# Patient Record
Sex: Female | Born: 1940 | Race: White | Hispanic: No | Marital: Single | Smoking: Never smoker
Health system: Southern US, Community
[De-identification: ages and names within clinical notes are randomized; demographics above are authoritative.]

## PROBLEM LIST (undated history)

## (undated) ENCOUNTER — Emergency Department (HOSPITAL_COMMUNITY): Payer: Medicare PPO | Source: Home / Self Care

## (undated) DIAGNOSIS — M549 Dorsalgia, unspecified: Secondary | ICD-10-CM

## (undated) DIAGNOSIS — M316 Other giant cell arteritis: Secondary | ICD-10-CM

## (undated) DIAGNOSIS — J189 Pneumonia, unspecified organism: Secondary | ICD-10-CM

## (undated) DIAGNOSIS — G8929 Other chronic pain: Secondary | ICD-10-CM

## (undated) HISTORY — PX: TUBAL LIGATION: SHX77

---

## 2015-09-26 ENCOUNTER — Inpatient Hospital Stay
Admission: EM | Admit: 2015-09-26 | Discharge: 2015-09-28 | DRG: 683 | Payer: Medicare PPO | Attending: Internal Medicine | Admitting: Internal Medicine

## 2015-09-26 ENCOUNTER — Encounter: Payer: Self-pay | Admitting: Emergency Medicine

## 2015-09-26 DIAGNOSIS — N182 Chronic kidney disease, stage 2 (mild): Secondary | ICD-10-CM | POA: Diagnosis present

## 2015-09-26 DIAGNOSIS — N179 Acute kidney failure, unspecified: Secondary | ICD-10-CM | POA: Diagnosis not present

## 2015-09-26 DIAGNOSIS — I129 Hypertensive chronic kidney disease with stage 1 through stage 4 chronic kidney disease, or unspecified chronic kidney disease: Secondary | ICD-10-CM | POA: Diagnosis present

## 2015-09-26 DIAGNOSIS — Z79899 Other long term (current) drug therapy: Secondary | ICD-10-CM | POA: Diagnosis not present

## 2015-09-26 DIAGNOSIS — G8929 Other chronic pain: Secondary | ICD-10-CM | POA: Diagnosis present

## 2015-09-26 DIAGNOSIS — Z9851 Tubal ligation status: Secondary | ICD-10-CM

## 2015-09-26 DIAGNOSIS — Z7952 Long term (current) use of systemic steroids: Secondary | ICD-10-CM | POA: Diagnosis not present

## 2015-09-26 DIAGNOSIS — N281 Cyst of kidney, acquired: Secondary | ICD-10-CM | POA: Diagnosis present

## 2015-09-26 DIAGNOSIS — N390 Urinary tract infection, site not specified: Secondary | ICD-10-CM | POA: Diagnosis present

## 2015-09-26 DIAGNOSIS — E860 Dehydration: Secondary | ICD-10-CM | POA: Diagnosis present

## 2015-09-26 DIAGNOSIS — E871 Hypo-osmolality and hyponatremia: Secondary | ICD-10-CM

## 2015-09-26 DIAGNOSIS — M1990 Unspecified osteoarthritis, unspecified site: Secondary | ICD-10-CM | POA: Diagnosis present

## 2015-09-26 DIAGNOSIS — I959 Hypotension, unspecified: Secondary | ICD-10-CM

## 2015-09-26 DIAGNOSIS — M545 Low back pain: Secondary | ICD-10-CM | POA: Diagnosis present

## 2015-09-26 HISTORY — DX: Other giant cell arteritis: M31.6

## 2015-09-26 HISTORY — DX: Other chronic pain: G89.29

## 2015-09-26 HISTORY — DX: Pneumonia, unspecified organism: J18.9

## 2015-09-26 HISTORY — DX: Dorsalgia, unspecified: M54.9

## 2015-09-26 LAB — BASIC METABOLIC PANEL
Anion gap: 22 — ABNORMAL HIGH (ref 5–15)
BUN: 109 mg/dL — AB (ref 6–20)
CO2: 13 mmol/L — ABNORMAL LOW (ref 22–32)
CREATININE: 4.89 mg/dL — AB (ref 0.44–1.00)
Calcium: 9 mg/dL (ref 8.9–10.3)
Chloride: 96 mmol/L — ABNORMAL LOW (ref 101–111)
GFR, EST AFRICAN AMERICAN: 9 mL/min — AB (ref >60)
GFR, EST NON AFRICAN AMERICAN: 8 mL/min — AB (ref >60)
Glucose, Bld: 113 mg/dL — ABNORMAL HIGH (ref 65–99)
POTASSIUM: 4.8 mmol/L (ref 3.5–5.1)
SODIUM: 131 mmol/L — AB (ref 135–145)

## 2015-09-26 LAB — CBC
HCT: 46.8 % (ref 35.0–47.0)
Hemoglobin: 15 g/dL (ref 12.0–16.0)
MCH: 30 pg (ref 26.0–34.0)
MCHC: 32 g/dL (ref 32.0–36.0)
MCV: 93.8 fL (ref 80.0–100.0)
PLATELETS: 305 10*3/uL (ref 150–440)
RBC: 4.99 MIL/uL (ref 3.80–5.20)
RDW: 13.6 % (ref 11.5–14.5)
WBC: 15.2 10*3/uL — AB (ref 3.6–11.0)

## 2015-09-26 LAB — URINALYSIS COMPLETE WITH MICROSCOPIC (ARMC ONLY)
Bilirubin Urine: NEGATIVE
GLUCOSE, UA: NEGATIVE mg/dL
NITRITE: NEGATIVE
Protein, ur: NEGATIVE mg/dL
Specific Gravity, Urine: 1.009 (ref 1.005–1.030)
pH: 5 (ref 5.0–8.0)

## 2015-09-26 LAB — CK: CK TOTAL: 116 U/L (ref 38–234)

## 2015-09-26 MED ORDER — CLONAZEPAM 0.25 MG PO TBDP: 0.2500 mg | ORAL_TABLET | Freq: Every day | ORAL | Status: DC

## 2015-09-26 MED ORDER — ACETAMINOPHEN 325 MG PO TABS: 650.0000 mg | ORAL_TABLET | Freq: Four times a day (QID) | ORAL | Status: DC | PRN

## 2015-09-26 MED ORDER — DOCUSATE SODIUM 100 MG PO CAPS
100.0000 mg | ORAL_CAPSULE | Freq: Two times a day (BID) | ORAL | Status: DC
  Administered 2015-09-27 – 2015-09-28 (×3): 100 mg via ORAL
  Filled 2015-09-26 (×3): qty 1

## 2015-09-26 MED ORDER — SODIUM CHLORIDE 0.9 % IV SOLN
INTRAVENOUS | Status: DC
  Administered 2015-09-27: 01:00:00 via INTRAVENOUS

## 2015-09-26 MED ORDER — CYCLOBENZAPRINE HCL 10 MG PO TABS
10.0000 mg | ORAL_TABLET | Freq: Two times a day (BID) | ORAL | Status: DC
Start: 2015-09-26 — End: 2015-09-28
  Administered 2015-09-26 – 2015-09-28 (×4): 10 mg via ORAL
  Filled 2015-09-26 (×4): qty 1

## 2015-09-26 MED ORDER — CIPROFLOXACIN IN D5W 200 MG/100ML IV SOLN
200.0000 mg | INTRAVENOUS | Status: DC
  Administered 2015-09-26 – 2015-09-27 (×2): 200 mg via INTRAVENOUS
  Filled 2015-09-26 (×3): qty 100

## 2015-09-26 MED ORDER — BISACODYL 10 MG RE SUPP
10.0000 mg | Freq: Every day | RECTAL | Status: DC | PRN
  Filled 2015-09-26: qty 1

## 2015-09-26 MED ORDER — PANTOPRAZOLE SODIUM 40 MG PO TBEC
40.0000 mg | DELAYED_RELEASE_TABLET | Freq: Every day | ORAL | Status: DC
  Administered 2015-09-27 – 2015-09-28 (×2): 40 mg via ORAL
  Filled 2015-09-26 (×2): qty 1

## 2015-09-26 MED ORDER — PREDNISONE 5 MG PO TABS
7.5000 mg | ORAL_TABLET | Freq: Every day | ORAL | Status: DC
  Administered 2015-09-27 – 2015-09-28 (×2): 7.5 mg via ORAL
  Filled 2015-09-26 (×2): qty 2

## 2015-09-26 MED ORDER — ACETAMINOPHEN 650 MG RE SUPP: 650.0000 mg | Freq: Four times a day (QID) | RECTAL | Status: DC | PRN

## 2015-09-26 MED ORDER — HEPARIN SODIUM (PORCINE) 5000 UNIT/ML IJ SOLN
5000.0000 [IU] | Freq: Three times a day (TID) | INTRAMUSCULAR | Status: DC
  Administered 2015-09-26 – 2015-09-27 (×4): 5000 [IU] via SUBCUTANEOUS
  Filled 2015-09-26 (×4): qty 1

## 2015-09-26 MED ORDER — ALBUTEROL SULFATE (2.5 MG/3ML) 0.083% IN NEBU: 2.5000 mg | INHALATION_SOLUTION | RESPIRATORY_TRACT | Status: DC | PRN

## 2015-09-26 MED ORDER — SODIUM CHLORIDE 0.9 % IV BOLUS (SEPSIS)
1000.0000 mL | Freq: Once | INTRAVENOUS | Status: AC
  Administered 2015-09-26: 1000 mL via INTRAVENOUS

## 2015-09-26 MED ORDER — LORATADINE 10 MG PO TABS: 10.0000 mg | ORAL_TABLET | Freq: Every day | ORAL | Status: DC | PRN

## 2015-09-26 MED ORDER — ONDANSETRON HCL 4 MG/2ML IJ SOLN: 4.0000 mg | Freq: Four times a day (QID) | INTRAMUSCULAR | Status: DC | PRN

## 2015-09-26 MED ORDER — HYDROCODONE-ACETAMINOPHEN 5-325 MG PO TABS
1.0000 | ORAL_TABLET | ORAL | Status: DC | PRN
  Administered 2015-09-27: 1 via ORAL
  Filled 2015-09-26: qty 1

## 2015-09-26 MED ORDER — CLONAZEPAM 0.5 MG PO TABS
ORAL_TABLET | ORAL | Status: AC
  Administered 2015-09-26: 0.25 mg
  Filled 2015-09-26: qty 1

## 2015-09-26 MED ORDER — ONDANSETRON HCL 4 MG PO TABS
4.0000 mg | ORAL_TABLET | Freq: Four times a day (QID) | ORAL | Status: DC | PRN
Start: 2015-09-26 — End: 2015-09-28

## 2015-09-26 MED ORDER — POLYETHYLENE GLYCOL 3350 17 G PO PACK: 17.0000 g | PACK | Freq: Every day | ORAL | Status: DC | PRN

## 2015-09-26 NOTE — H&P (Signed)
[facility] - [place] at [facility]   PATIENT NAME: Kerri Carroll    MR#:  [id number]  DATE OF BIRTH:  August 07, 1941  DATE OF ADMISSION:  09/26/2015  PRIMARY CARE PHYSICIAN: Lorenda Ishihara   REQUESTING/REFERRING PHYSICIAN: Dr. Sharma Covert  CHIEF COMPLAINT:   Chief Complaint  Patient presents with  . Hypotension    HISTORY OF PRESENT ILLNESS:  Kerri Carroll  is a 74 y.o. female with a known history of CKD, hypertension, arthritis who recently had acutely worsening low back pain was found to be hypotensive at physical therapy. Was sent to the emergency room her blood pressures have been fluctuating between systolic of 60-90. Has received 2 doses of IV normal saline bolus. Creatinine elevated 4.69 with BUN of 109. Patient continues to have low normal blood pressure along with urinary tract infection and is being admitted to the hospitalist service for acute renal failure. She tells me that she does have some renal insufficiency but is not sure what her GFR or creatinine are. All her health care has been at [facility]. She also has doctors making house call as her PCP. She has had some urinary urgency and burning. No blood in urine. She has been mostly bedbound since her worsening back pain.  PAST MEDICAL HISTORY:   Past Medical History  Diagnosis Date  . Chronic back pain   . Pneumonia   . Arteritis (HCC)     PAST SURGICAL HISTORY:   Past Surgical History  Procedure Laterality Date  . Tubal ligation      SOCIAL HISTORY:   Social History  Substance Use Topics  . Smoking status: Never Smoker   . Smokeless tobacco: Never Used  . Alcohol Use: 0.6 oz/week    1 Glasses of wine per week     Comment: Everyday    FAMILY HISTORY:  History reviewed. No pertinent family history.  DRUG ALLERGIES:   Allergies  Allergen Reactions  . Shellfish Allergy Other (See Comments)    Reaction:  Makes pt sweaty     REVIEW OF SYSTEMS:   Review of  Systems  Constitutional: Positive for malaise/fatigue. Negative for fever, chills and weight loss.  HENT: Negative for hearing loss and nosebleeds.   Eyes: Negative for blurred vision, double vision and pain.  Respiratory: Negative for cough, hemoptysis, sputum production, shortness of breath and wheezing.   Cardiovascular: Negative for chest pain, palpitations, orthopnea and leg swelling.  Gastrointestinal: Negative for nausea, vomiting, abdominal pain, diarrhea and constipation.  Genitourinary: Positive for dysuria and urgency. Negative for hematuria.  Musculoskeletal: Positive for back pain and joint pain. Negative for myalgias and falls.  Skin: Negative for rash.  Neurological: Positive for dizziness and weakness. Negative for tremors, sensory change, speech change, focal weakness, seizures and headaches.  Endo/Heme/Allergies: Does not bruise/bleed easily.  Psychiatric/Behavioral: Negative for depression and memory loss. The patient is not nervous/anxious.     MEDICATIONS AT HOME:   Prior to Admission medications   Medication Sig Start Date End Date Taking? Authorizing Provider  Biotin 1000 MCG tablet Take 1,000 mcg by mouth daily.   Yes Historical Provider, MD  cholecalciferol (VITAMIN D) 1000 UNITS tablet Take 1,000 Units by mouth daily.   Yes Historical Provider, MD  clonazePAM (KLONOPIN) 0.25 MG disintegrating tablet Take 0.25 mg by mouth at bedtime.   Yes Historical Provider, MD  cyclobenzaprine (FLEXERIL) 5 MG tablet Take 10 mg by mouth 2 (two) times daily.   Yes Historical Provider, MD  furosemide (LASIX)  20 MG tablet Take 20 mg by mouth daily.   Yes Historical Provider, MD  lisinopril (PRINIVIL,ZESTRIL) 10 MG tablet Take 10 mg by mouth daily.   Yes Historical Provider, MD  loratadine (CLARITIN) 10 MG tablet Take 10 mg by mouth daily as needed for allergies.   Yes Historical Provider, MD  metoprolol tartrate (LOPRESSOR) 25 MG tablet Take 25 mg by mouth 2 (two) times daily.   Yes  Historical Provider, MD  omeprazole (PRILOSEC) 20 MG capsule Take 20 mg by mouth daily.   Yes Historical Provider, MD  potassium chloride (K-DUR) 10 MEQ tablet Take 10 mEq by mouth daily.   Yes Historical Provider, MD  predniSONE (DELTASONE) 5 MG tablet Take 7.5 mg by mouth daily.   Yes Historical Provider, MD  vitamin B-12 (CYANOCOBALAMIN) 1000 MCG tablet Take 1,000 mcg by mouth daily.   Yes Historical Provider, MD      VITAL SIGNS:  Blood pressure 91/74, pulse 95, temperature 97.6 F (36.4 C), temperature source Oral, resp. rate 20, height 5' 2.5" (1.588 m), weight 65.772 kg (145 lb), SpO2 100 %.  PHYSICAL EXAMINATION:  Physical Exam  GENERAL:  74 y.o.-year-old patient lying in the bed with no acute distress.  EYES: Pupils equal, round, reactive to light and accommodation. No scleral icterus. Extraocular muscles intact.  HEENT: Head atraumatic, normocephalic. Oropharynx and nasopharynx clear. No oropharyngeal erythema, moist oral mucosa  NECK:  Supple, no jugular venous distention. No thyroid enlargement, no tenderness.  LUNGS: Normal breath sounds bilaterally, no wheezing, rales, rhonchi. No use of accessory muscles of respiration.  CARDIOVASCULAR: S1, S2 normal. No murmurs, rubs, or gallops.  ABDOMEN: Soft, nontender, nondistended. Bowel sounds present. No organomegaly or mass.  EXTREMITIES: No pedal edema, cyanosis, or clubbing. + 2 pedal & radial pulses b/l.   NEUROLOGIC: Cranial nerves II through XII are intact. No focal Motor or sensory deficits appreciated b/l PSYCHIATRIC: The patient is alert and oriented x 3. Good affect.  SKIN: No obvious rash, lesion, or ulcer.   LABORATORY PANEL:   CBC  Recent Labs Lab 09/26/15 1651  WBC 15.2*  HGB 15.0  HCT 46.8  PLT 305   ------------------------------------------------------------------------------------------------------------------  Chemistries   Recent Labs Lab 09/26/15 1651  NA 131*  K 4.8  CL 96*  CO2 13*   GLUCOSE 113*  BUN 109*  CREATININE 4.89*  CALCIUM 9.0   ------------------------------------------------------------------------------------------------------------------  Cardiac Enzymes No results for input(s): TROPONINI in the last 168 hours. ------------------------------------------------------------------------------------------------------------------  RADIOLOGY:  No results found.   IMPRESSION AND PLAN:   * ARF over CKD Likely from dehydration and continuing to take her Lasix and other antihypertensive medications. Medications held. Start IV fluids and monitor input and output along with daily weights. Check renal ultrasound. Consult nephrology if no improvement. Check CK as patient has been mostly bedbound  * UTI Start renally dosed ciprofloxacin through IV and send for urine cultures  * Dehydration Due to decreased oral intake and continuing to take Lasix. Which increased oral fluid intake. Start IV fluids  * Hypertension Home medications  * Chronic back pain Continue pain medications. Continue physical therapy while patient in hospital which she has been getting outside.  * DVT prophylaxis with heparin subcutaneous  All the records are reviewed and case discussed with ED provider. Management plans discussed with the patient, family and they are in agreement.  CODE STATUS: Full  TOTAL TIME TAKING CARE OF THIS PATIENT: 40 minutes.  Milagros Loll R M.D on 09/26/2015 at 8:11 PM  Between 7am to 6pm - Pager - [phone]  After 6pm go to [email or web address] - password [email or web address]  Fabio Neighbors[facility]  Office  [phone]  CC: Primary care physician; Lorenda IshiharaVaradarajan, Rupashree   Note: This dictation was prepared with Dragon dictation along with smaller phrase technology. Any transcriptional errors that result from this process are unintentional.

## 2015-09-26 NOTE — ED Notes (Signed)
Pt's BP 78/52. Pt placed in Trendelenburg and 1L bolus started. Pt's BP 92/68 at this time.

## 2015-09-26 NOTE — ED Notes (Signed)
Per EMS pt is from [facility]. Pt was to be seen by physical therapy today but when they checked her BP it was low. 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 Pt states she was dx with fungal pneumonia and c/o generalized weakness since then.

## 2015-09-26 NOTE — ED Provider Notes (Signed)
[facility] Emergency Department Provider Note  ____________________________________________  Time seen: Approximately 4:22 PM  I have reviewed the triage vital signs and the nursing notes.   HISTORY  Chief Complaint Hypotension  \  HPI Kerri Carroll is a 74 y.o. female with a history of hypertension on metoprolol, lisinopril, furosemide presenting with hypotension. Patient states that she lives at [facility], and has been having physical therapy for chronic back pain. Over the past day, her back pain has been "better than its felt in a long time." Today she went for physical therapy and was told that her blood pressure was low. She did not have any symptoms, including lightheadedness, shortness of breath, syncope, chest pain, or palpitations. She has not recently had any illness such as cough or cold symptoms, nausea vomiting or diarrhea. She states it is not probable but it is possible that she may have doubled up her metoprolol dose this morning, but she is unsure. The only other major medical changes at this time and she has been stable on prednisone for one urine is currently undergoing a taper.   Past Medical History  Diagnosis Date  . Chronic back pain   . Pneumonia   . Arteritis (HCC)     There are no active problems to display for this patient.   Past Surgical History  Procedure Laterality Date  . Tubal ligation      Current Outpatient Rx  Name  Route  Sig  Dispense  Refill  . Biotin 1000 MCG tablet   Oral   Take 1,000 mcg by mouth daily.         . cholecalciferol (VITAMIN D) 1000 UNITS tablet   Oral   Take 1,000 Units by mouth daily.         . clonazePAM (KLONOPIN) 0.25 MG disintegrating tablet   Oral   Take 0.25 mg by mouth at bedtime.         . cyclobenzaprine (FLEXERIL) 5 MG tablet   Oral   Take 10 mg by mouth 2 (two) times daily.         . furosemide (LASIX) 20 MG tablet   Oral   Take 20 mg by  mouth daily.         Marland Kitchen lisinopril (PRINIVIL,ZESTRIL) 10 MG tablet   Oral   Take 10 mg by mouth daily.         Marland Kitchen loratadine (CLARITIN) 10 MG tablet   Oral   Take 10 mg by mouth daily as needed for allergies.         . metoprolol tartrate (LOPRESSOR) 25 MG tablet   Oral   Take 25 mg by mouth 2 (two) times daily.         Marland Kitchen omeprazole (PRILOSEC) 20 MG capsule   Oral   Take 20 mg by mouth daily.         . potassium chloride (K-DUR) 10 MEQ tablet   Oral   Take 10 mEq by mouth daily.         . predniSONE (DELTASONE) 5 MG tablet   Oral   Take 7.5 mg by mouth daily.         . vitamin B-12 (CYANOCOBALAMIN) 1000 MCG tablet   Oral   Take 1,000 mcg by mouth daily.           Allergies Shellfish allergy  History reviewed. No pertinent family history.  Social History Social History  Substance Use Topics  .  Smoking status: Never Smoker   . Smokeless tobacco: Never Used  . Alcohol Use: 0.6 oz/week    1 Glasses of wine per week     Comment: Everyday    Review of Systems Constitutional: No fever/chills. No sick or lightheadedness. Eyes: No visual changes. No blurred or double vision. ENT: No sore throat. Cardiovascular: Denies chest pain, palpitations. Respiratory: Denies shortness of breath.  No cough. Gastrointestinal: No abdominal pain.  No nausea, no vomiting.  No diarrhea.  No constipation. Denies melena or bright red blood per rectum. Genitourinary: Negative for dysuria. Musculoskeletal: Positive for chronic back pain. Skin: Negative for rash. Neurological: Negative for headaches, focal weakness or numbness. No tingling. No changes in gait.  10-point ROS otherwise negative.  ____________________________________________   PHYSICAL EXAM:  VITAL SIGNS: ED Triage Vitals  Enc Vitals Group     BP 09/26/15 1532 94/68 mmHg     Pulse Rate 09/26/15 1532 79     Resp 09/26/15 1532 18     Temp 09/26/15 1532 97.6 F (36.4 C)     Temp Source 09/26/15 1532  Oral     SpO2 09/26/15 1532 100 %     Weight 09/26/15 1532 145 lb (65.772 kg)     Height 09/26/15 1532 5' 2.5" (1.588 m)     Head Cir --      Peak Flow --      Pain Score 09/26/15 1533 4     Pain Loc --      Pain Edu? --      Excl. in GC? --     Constitutional: Alert and oriented. Well appearing and in no acute distress. Answer question appropriately. Eyes: Conjunctivae are normal.  EOMI. Head: Atraumatic. Nose: No congestion/rhinnorhea. Mouth/Throat: Mucous membranes are mildly dry.  Neck: No stridor.  Supple.  JVD. Full range of motion without pain. Cardiovascular: Normal rate, regular rhythm. No murmurs, rubs or gallops.  Respiratory: Normal respiratory effort.  No retractions. Lungs CTAB.  No wheezes, rales or ronchi. Gastrointestinal: Soft and nontender. No distention. No peritoneal signs. Musculoskeletal: No LE edema.  Neurologic:  Alert and oriented 3. Face and smile are symmetric. EOMI.  Moves all extremities well. Not symptomatic when positioned from lying down to sitting. Skin:  Skin is warm, dry and intact. No rash noted. Psychiatric: Mood and affect are normal. Speech and behavior are normal.  Normal judgement.  ____________________________________________   LABS (all labs ordered are listed, but only abnormal results are displayed)  Labs Reviewed  CBC - Abnormal; Notable for the following:    WBC 15.2 (*)    All other components within normal limits  BASIC METABOLIC PANEL - Abnormal; Notable for the following:    Sodium 131 (*)    Chloride 96 (*)    CO2 13 (*)    Glucose, Bld 113 (*)    BUN 109 (*)    Creatinine, Ser 4.89 (*)    GFR calc non Af Amer 8 (*)    GFR calc Af Amer 9 (*)    Anion gap 22 (*)    All other components within normal limits  URINALYSIS COMPLETEWITH MICROSCOPIC ([facility] ONLY) - Abnormal; Notable for the following:    Color, Urine YELLOW (*)    APPearance CLOUDY (*)    Ketones, ur TRACE (*)    Hgb urine dipstick 2+ (*)    Leukocytes,  UA 3+ (*)    Bacteria, UA RARE (*)    Squamous Epithelial / LPF 6-30 (*)  All other components within normal limits   ____________________________________________  EKG  ED ECG REPORT I, Rockne MenghiniNorman, Anne-Caroline, the attending physician, personally viewed and interpreted this ECG.   Date: 09/26/2015  EKG Time: 1654 1654   Rate: 91  Rhythm: normal sinus rhythm  Axis: Leftward  Intervals:left anterior fascicular block  ST&T Change: No ST elevation. No ischemic changes.  ____________________________________________  RADIOLOGY  No results found.  ____________________________________________   PROCEDURES  Procedure(s) performed: None  Critical Care performed: No ____________________________________________   INITIAL IMPRESSION / ASSESSMENT AND PLAN / ED COURSE  Pertinent labs & imaging results that were available during my care of the patient were reviewed by me and considered in my medical decision making (see chart for details).  74 y.o. female with a history of chronic back pain, hypertension on multiple agents, resenting with asymptomatic hypertension. I have rechecked her blood pressure multiple times in the room and she maintains a blood pressure of approximately 97/72. She states that her baseline blood pressure is usually 130s over 80s. After positional changes she is completely asymptomatic. I will get a screening EKG, as well as basic labs to evaluate for anemia or urinary tract infection. If her blood pressure stabilizes or improves with IV fluids, and her lab work is reassuring, I anticipate discharge home with close PMD follow-up.  ----------------------------------------- 6:34 PM on 09/26/2015 -----------------------------------------  Patient EKG is reassuring and she continues to be asymptomatic. However, the patient is still clinically markedly orthostatic. I will give her another liter fluid and reevaluate her. She does not improve, she will need admission.  If she does improve we will plan to discharge her home.  ----------------------------------------- 7:52 PM on 09/26/2015 -----------------------------------------  The patient continued to be actively orthostatic on exam. In addition her CMP came back with a market elevation in her renal function. She states that she has had mild renal insufficiency in the past, we do not have a baseline, but her creatinine at this point is too high to be able to go home. She is also hyponatremic. We will plan admission.  ____________________________________________  FINAL CLINICAL IMPRESSION(S) / ED DIAGNOSES  Final diagnoses:  Acute renal failure, unspecified acute renal failure type (HCC)  Hyponatremia  Hypotension, unspecified hypotension type      NEW MEDICATIONS STARTED DURING THIS VISIT:  New Prescriptions   No medications on file     Rockne MenghiniAnne-Caroline Kasumi Ditullio, MD 09/26/15 1953

## 2015-09-27 ENCOUNTER — Encounter: Payer: Self-pay | Admitting: *Deleted

## 2015-09-27 ENCOUNTER — Inpatient Hospital Stay: Payer: Medicare PPO

## 2015-09-27 LAB — MRSA PCR SCREENING: MRSA by PCR: NEGATIVE

## 2015-09-27 MED ORDER — CLONAZEPAM 0.5 MG PO TABS
0.2500 mg | ORAL_TABLET | Freq: Every day | ORAL | Status: DC
  Administered 2015-09-27: 0.25 mg via ORAL
  Filled 2015-09-27: qty 1

## 2015-09-27 MED ORDER — SODIUM CHLORIDE 0.9 % IV BOLUS (SEPSIS)
250.0000 mL | Freq: Once | INTRAVENOUS | Status: AC
  Administered 2015-09-27: 250 mL via INTRAVENOUS

## 2015-09-27 NOTE — Evaluation (Signed)
Physical Therapy Evaluation Patient Details Name: Kerri ReiningJoanne Carroll MRN: [id number] DOB: 1941-09-04 Today's Date: 09/27/2015   History of Present Illness  Pt is a 74 y.o. female presenting to hospital after PT coming to see pt and noting pt to be hypotensive.  Pt admitted to hospital with acute renal failure.  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  Clinical Impression  Prior to a couple weeks ago, pt was independent with functional mobility but after sustaining a "strained" back a couple weeks ago pt has been limited to functional mobility within her home using rollator.  Pt reports she has not been eating much d/t unable to make it to dining room for meals (pt reports there is a service where they can bring food to her though).  Pt also reports not drinking as much anymore so she does not have to get up to go to the bathroom as much (d/t low back pain); pt reporting (upon PT entering room) that she just urinated in her bed and NA came to assist pt in clean-up.  [facility].  Currently pt requires assist with bed mobility and transfers and only willing to walk a few feet bed to recliner (all functional mobility limited d/t c/o low back pain and pain 4/10 at rest and 8-10/10 with functional mobility).  Pt would benefit from skilled PT to address noted impairments and functional limitations.  Pt does not appear to currently be able to manage independently at home alone.  Recommend pt discharge to STR when medically appropriate (pt currently refusing STR).     Follow Up Recommendations SNF (pt refusing STR)    Equipment Recommendations       Recommendations for Other Services       Precautions / Restrictions Precautions Precautions: Fall Precaution Comments: Blind L eye Restrictions Weight Bearing Restrictions: No      Mobility  Bed Mobility Overal bed mobility: Needs Assistance Bed Mobility: Supine to Sit;Rolling Rolling:  Supervision (use of bed rail (to R))   Supine to sit: Mod assist     General bed mobility comments: assist for trunk and B LE's d/t c/o LBP  Transfers Overall transfer level: Needs assistance Equipment used: Rolling walker (2 wheeled) Transfers: Sit to/from Stand Sit to Stand: Min assist;+2 safety/equipment         General transfer comment: increased effort to stand d/t low back pain but no loss of balance noted  Ambulation/Gait Ambulation/Gait assistance: Min assist;+2 safety/equipment Ambulation Distance (Feet): 3 Feet (bed to recliner) Assistive device: Rolling walker (2 wheeled)   Gait velocity: decreased   General Gait Details: decreased B step length/foot clearance/heelstrike; occasional assist to move RW (pt used to rollator); limited distance d/t low back pain  Stairs            Wheelchair Mobility    Modified Rankin (Stroke Patients Only)       Balance Overall balance assessment: Needs assistance Sitting-balance support: Bilateral upper extremity supported;Feet supported Sitting balance-Leahy Scale: Fair     Standing balance support: Bilateral upper extremity supported (on RW) Standing balance-Leahy Scale: Good                               Pertinent Vitals/Pain Pain Assessment: 0-10 Pain Score:  (4/10 at rest; 8-10/10 with activity) Pain Location: low back Pain Descriptors / Indicators: Sore;Tender;Sharp;Shooting Pain Intervention(s): Limited activity within patient's tolerance;Monitored during session;Repositioned (Nursing  notified of pt's pain)  Vitals stable and WFL throughout treatment session.    Home Living Family/patient expects to be discharged to:: Private residence Living Arrangements: Alone   Type of Home: Independent living facility [facility]) Home Access: Level entry;Elevator     Home Layout: One level Home Equipment: Environmental consultant - 4 wheels      Prior Function Level of Independence: Independent with assistive  device(s)         Comments: using rollator for functional mobility     Hand Dominance        Extremity/Trunk Assessment   Upper Extremity Assessment: Generalized weakness           Lower Extremity Assessment: Generalized weakness         Communication   Communication: No difficulties  Cognition Arousal/Alertness: Awake/alert Behavior During Therapy: WFL for tasks assessed/performed Overall Cognitive Status: Within Functional Limits for tasks assessed                      General Comments General comments (skin integrity, edema, etc.): toenails overgrown B feet  Nursing cleared pt for participation in physical therapy.  Pt agreeable to PT session. Pt only agreeable to getting OOB with 2 assist d/t not "trusting" any PT.    Exercises        Assessment/Plan    PT Assessment Patient needs continued PT services  PT Diagnosis Difficulty walking   PT Problem List Decreased strength;Decreased activity tolerance;Decreased balance;Decreased mobility;Pain  PT Treatment Interventions DME instruction;Gait training;Functional mobility training;Therapeutic activities;Therapeutic exercise;Balance training;Patient/family education   PT Goals (Current goals can be found in the Care Plan section) Acute Rehab PT Goals Patient Stated Goal: to go home by myself PT Goal Formulation: With patient Time For Goal Achievement: 10/11/15 Potential to Achieve Goals: Fair    Frequency Min 2X/week   Barriers to discharge Decreased caregiver support      Co-evaluation               End of Session Equipment Utilized During Treatment: Gait belt Activity Tolerance: Patient limited by pain Patient left: in chair;with call bell/phone within reach;with chair alarm set Nurse Communication: Mobility status;Precautions (Pt's pain concerns)         Time: 0865-7846 PT Time Calculation (min) (ACUTE ONLY): 38 min   Charges:   PT Evaluation $Initial PT Evaluation Tier I:  1 Procedure PT Treatments $Therapeutic Activity: 8-22 mins   PT G CodesHendricks Limes Oct 05, 2015, 4:09 PM Hendricks Limes, PT [phone]

## 2015-09-27 NOTE — Progress Notes (Signed)
Pt bp low 84/64 called on call Dr. Dr Tobi BastosPyreddy. Orders given to give 250cc ns bolus.

## 2015-09-27 NOTE — Progress Notes (Signed)
[facility] - [place] at [facility]   PATIENT NAME: Kerri Carroll    MR#:  [id number]  DATE OF BIRTH:  1940-12-04  SUBJECTIVE:  Feels weak and tired  REVIEW OF SYSTEMS:   Review of Systems  Constitutional: Negative for fever, chills and weight loss.  HENT: Negative for ear discharge, ear pain and nosebleeds.   Eyes: Negative for blurred vision, pain and discharge.  Respiratory: Negative for sputum production, shortness of breath, wheezing and stridor.   Cardiovascular: Negative for chest pain, palpitations, orthopnea and PND.  Gastrointestinal: Negative for nausea, vomiting, abdominal pain and diarrhea.  Genitourinary: Positive for dysuria. Negative for urgency and frequency.  Musculoskeletal: Negative for back pain and joint pain.  Neurological: Positive for weakness. Negative for sensory change, speech change and focal weakness.  Psychiatric/Behavioral: Negative for depression and hallucinations. The patient is not nervous/anxious.   All other systems reviewed and are negative.  Tolerating Diet:yes Tolerating PT: pending  DRUG ALLERGIES:   Allergies  Allergen Reactions  . Shellfish Allergy Other (See Comments)    Reaction:  Makes pt sweaty     VITALS:  Blood pressure 113/59, pulse 99, temperature 98.5 F (36.9 C), temperature source Oral, resp. rate 20, height 5' 2.5" (1.588 m), weight 148 lb 12.8 oz (67.495 kg), SpO2 100 %.  PHYSICAL EXAMINATION:   Physical Exam  GENERAL:  74 y.o.-year-old patient lying in the bed with no acute distress.  EYES: Pupils equal, round, reactive to light and accommodation. No scleral icterus. Extraocular muscles intact.  HEENT: Head atraumatic, normocephalic. Oropharynx and nasopharynx clear.  NECK:  Supple, no jugular venous distention. No thyroid enlargement, no tenderness.  LUNGS: Normal breath sounds bilaterally, no wheezing, rales, rhonchi. No use of accessory muscles of respiration.  CARDIOVASCULAR: S1, S2  normal. No murmurs, rubs, or gallops.  ABDOMEN: Soft, nontender, nondistended. Bowel sounds present. No organomegaly or mass.  EXTREMITIES: No cyanosis, clubbing or edema b/l.    NEUROLOGIC: Cranial nerves II through XII are intact. No focal Motor or sensory deficits b/l.   PSYCHIATRIC: The patient is alert and oriented x 3.  SKIN: No obvious rash, lesion, or ulcer.    LABORATORY PANEL:   CBC  Recent Labs Lab 09/26/15 1651  WBC 15.2*  HGB 15.0  HCT 46.8  PLT 305    Chemistries   Recent Labs Lab 09/26/15 1651  NA 131*  K 4.8  CL 96*  CO2 13*  GLUCOSE 113*  BUN 109*  CREATININE 4.89*  CALCIUM 9.0    Cardiac Enzymes No results for input(s): TROPONINI in the last 168 hours.  RADIOLOGY:  [place] Renal  09/27/2015  CLINICAL DATA:  Acute renal failure. EXAM: RENAL / URINARY TRACT ULTRASOUND COMPLETE COMPARISON:  None. FINDINGS: Right Kidney: Length: 9.1 cm. 2.1 cm cyst is noted in midpole. Echogenicity within normal limits. No mass or hydronephrosis visualized. Left Kidney: Length: 9.1 cm. 1.4 cm cyst is noted in upper pole. Echogenicity within normal limits. No mass or hydronephrosis visualized. Bladder: Appears normal for degree of bladder distention. Bilateral ureteral jets are noted. IMPRESSION: Small bilateral simple renal cysts. No other significant abnormality seen in the kidneys. Electronically Signed   By: Lupita RaiderJames  Green Jr, M.D.   On: 09/27/2015 10:44   ASSESSMENT AND PLAN:  74 y.o. female with a known history of CKD, hypertension, arthritis who recently had acutely worsening low back pain was found to be hypotensive at physical therapy. Was sent to the emergency room her blood pressures have been  fluctuating between systolic of 60-90.  * ARF over CKD-II -Baseline creat 07/2015 is 1.3 -Likely from dehydration and continuing to take her Lasix and other antihypertensive medications. -Medications held. -Cont aggressive IV fluids and monitor input and output along with  daily weights. -Check renal ultrasound. -Check CK as patient has been mostly bedbound  * UTI -renally dosed ciprofloxacin through IV and send for urine cultures  * Dehydration -Due to decreased oral intake and continuing to take Lasix. -encourage po fluid intake  * Hypertension Home medications when bp improves after IV hydration  * Chronic back pain Continue pain medications. Continue physical therapy while patient in hospital which she has been getting outside.  * DVT prophylaxis with heparin subcutaneous    Case discussed with Care Management/Social Worker. Management plans discussed with the patient, family and they are in agreement.  CODE STATUS: full  DVT Prophylaxis: heparin  TOTAL TIME TAKING CARE OF THIS PATIENT: .  >50% time spent on counselling and coordination of care  POSSIBLE D/C IN 1-2 DAYS, DEPENDING ON CLINICAL CONDITION.   Kerri Carroll M.D on 09/27/2015 at 4:12 PM  Between 7am to 6pm - Pager - [phone]  After 6pm go to [email or web address] - password [email or web address] [facility]  [place]  Hospitalists  Office  [phone]  CC: Primary care physician; Kerri Carroll

## 2015-09-28 LAB — BASIC METABOLIC PANEL
ANION GAP: 9 (ref 5–15)
BUN: 48 mg/dL — ABNORMAL HIGH (ref 6–20)
CHLORIDE: 116 mmol/L — AB (ref 101–111)
CO2: 17 mmol/L — AB (ref 22–32)
CREATININE: 1.34 mg/dL — AB (ref 0.44–1.00)
Calcium: 8.2 mg/dL — ABNORMAL LOW (ref 8.9–10.3)
GFR calc non Af Amer: 38 mL/min — ABNORMAL LOW (ref >60)
GFR, EST AFRICAN AMERICAN: 44 mL/min — AB (ref >60)
Glucose, Bld: 79 mg/dL (ref 65–99)
Potassium: 3.8 mmol/L (ref 3.5–5.1)
Sodium: 142 mmol/L (ref 135–145)

## 2015-09-28 MED ORDER — LISINOPRIL 5 MG PO TABS: 10.0000 mg | ORAL_TABLET | Freq: Every day | ORAL | Status: AC

## 2015-09-28 MED ORDER — CIPROFLOXACIN HCL 500 MG PO TABS: 500.0000 mg | ORAL_TABLET | Freq: Two times a day (BID) | ORAL | Status: DC

## 2015-09-28 MED ORDER — VITAMIN D 1000 UNITS PO TABS
1000.0000 [IU] | ORAL_TABLET | Freq: Every day | ORAL | Status: DC
  Administered 2015-09-28: 1000 [IU] via ORAL
  Filled 2015-09-28: qty 1

## 2015-09-28 MED ORDER — HYDROCODONE-ACETAMINOPHEN 5-325 MG PO TABS: 1.0000 | ORAL_TABLET | Freq: Four times a day (QID) | ORAL | Status: AC | PRN

## 2015-09-28 MED ORDER — CIPROFLOXACIN HCL 500 MG PO TABS
500.0000 mg | ORAL_TABLET | Freq: Two times a day (BID) | ORAL | Status: DC
  Administered 2015-09-28: 500 mg via ORAL
  Filled 2015-09-28: qty 1

## 2015-09-28 MED ORDER — VITAMIN B-12 1000 MCG PO TABS
1000.0000 ug | ORAL_TABLET | Freq: Every day | ORAL | Status: DC
  Administered 2015-09-28: 1000 ug via ORAL
  Filled 2015-09-28: qty 1

## 2015-09-28 MED ORDER — CEPHALEXIN 500 MG PO CAPS: 500.0000 mg | ORAL_CAPSULE | Freq: Four times a day (QID) | ORAL | Status: DC

## 2015-09-28 NOTE — Progress Notes (Signed)
Discharge Note:  Pt given discharge instructions and prescription, pt verbalized understanding. [facility].

## 2015-09-28 NOTE — Care Management Important Message (Signed)
Important Message  Patient Details  Name: Kerri Carroll MRN: [id number] Date of Birth: Mar 28, 1941   Medicare Important Message Given:  Yes    Olegario MessierKathy A Mehek Grega 09/28/2015, 9:30 AM

## 2015-09-28 NOTE — Progress Notes (Signed)
Per patient she requires EMS home. Clinical Child psychotherapistocial Worker (CSW) confirmed patient's address and completed EMS form. RN will call EMS. Please reconsult if future social work needs arise. CSW signing off.   Jetta LoutBailey Morgan, LCSWA [phone]

## 2015-09-28 NOTE — Clinical Social Work Note (Signed)
Clinical Social Work Assessment  Patient Details  Name: Kerri Carroll MRN: [id number] Date of Birth: Mar 19, 1941  Date of referral:  09/28/15               Reason for consult:  Facility Placement, Other (Comment Required) (Patient refusing SNF )                Permission sought to share information with:  Case Manager Permission granted to share information::  Yes, Verbal Permission Granted  Name::        Agency::     Relationship::     Contact Information:     Housing/Transportation Living arrangements for the past 2 months:  [place] of Information:  Patient Patient Interpreter Needed:  None Criminal Activity/Legal Involvement Pertinent to Current Situation/Hospitalization:  No - Comment as needed Significant Relationships:  Warehouse manager, Other(Comment) ([place] ) Lives with:  Sports coach, Facility Resident (Independent Living Resident ) Do you feel safe going back to the place where you live?  Yes Need for family participation in patient care:  No ([place])  Care giving concerns:  Patient is a resident at [facility].    Social Worker assessment / plan: Holiday representative (CSW) received verbal consult from PT that the recommendation is SNF however patient is going to refuse. Per PT once patient gets up she moves fairly well. CSW met with patient to discuss D/C plan. Patient was sitting up in bed and was alert and oriented. CSW introduced self and explained role of CSW department. Patient reported that she has been living at [facility] for a little over a year and enjoys it there. Patient reported that she lives alone in her apartment. Per patient she has no close family in the area. Patient reported that she has a few cousins that she is not close to. CSW explained that PT is recommending SNF. Patient politely refused SNF and reported that she is going home today. Per patient she pays a friend for transportation and  to grocery shop for her. Patient reported that she is going to do PT at [facility]. Per patient she will call her friend to see if she is available today to pick her up from the hospital. RN case manager aware of above. Please reconsult if future social work needs arise. CSW signing off.   Employment status:  Retired Nurse, adult PT Recommendations:  [place] (Patient is refusing SNF ) Information / Referral to community resources:  Other (Comment Required) (Home Health )  Patient/Family's Response to care:  Patient refused SNF and reported that she is going home today.   Patient/Family's Understanding of and Emotional Response to Diagnosis, Current Treatment, and Prognosis:  Patient was pleasant throughout assessment.   Emotional Assessment Appearance:  Appears stated age Attitude/Demeanor/Rapport:    Affect (typically observed):  Accepting, Adaptable, Pleasant Orientation:  Oriented to Self, Oriented to Place, Oriented to  Time, Oriented to Situation Alcohol / Substance use:  Not Applicable Psych involvement (Current and /or in the community):  No (Comment)  Discharge Needs  Concerns to be addressed:  Denies Needs/Concerns at this time Readmission within the last 30 days:  No Current discharge risk:  None Barriers to Discharge:  No Barriers Identified   Loralyn Freshwater, LCSW 09/28/2015, 8:45 AM

## 2015-09-28 NOTE — Care Management (Signed)
Patient admitted from home with ARF.  [facility].  Patient states that she has no family support.  She does have a nurse tech named Scarlette CalicoFrances that she has hired for assistance who provides transportation when needed.  Patient states that she has a rollator walker at home.  PT has recommended home health PT.  Patient states that she only wants to use [facility].  Patient has been open with [facility] home [facility] in the past.  Message left for 2 legacy representatives Selena BattenKim [phone] and Carollee HerterShannon [phone].  Awaiting return call. Pt to discharge today.

## 2015-09-28 NOTE — Discharge Instructions (Signed)
HHPT °

## 2015-09-28 NOTE — Discharge Summary (Signed)
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 Chronic back pain   . Pneumonia   . Temporal arteritis [facility])     HOSPITAL COURSE:  74 y.o. female with a known history of CKD, hypertension, arthritis who recently had acutely worsening low back pain was found to be hypotensive at physical therapy. Was sent to the emergency room her blood pressures have been fluctuating between systolic of 60-90.  * ARF over CKD-II -Baseline creat 07/2015 is 1.3 -Likely from dehydration and continuing to take her Lasix and other antihypertensive medications. -pt recieved aggressive IV fluids - renal ultrasound showed simple cysts -creat 4.6--->1.34  * UTI -renally dosed ciprofloxacin  * Dehydration -Due to decreased oral intake and continuing to take Lasix. -encourage po fluid intake -resolved  * Hypertension Home medications when bp improves after IV hydration -decreased dose of lisinopril and lasix  * Chronic back pain Continue pain medications. Continue physical therapy while patient in hospital which she has been getting outside.  * DVT prophylaxis with heparin subcutaneous.  *h/o Temporal Arteritis -on po prednisone -pt scheduled to see dr Lavenia Atlas in few weeks  PT is recommending STR. Pt requesting to return to [facility]. Will  arrange HHPT Uses rollator walker CONSULTS OBTAINED:    none DRUG ALLERGIES:   Allergies  Allergen Reactions  . Shellfish Allergy Other (See Comments)    Reaction:  Makes pt sweaty     DISCHARGE MEDICATIONS:   Current Discharge Medication List    START taking these medications   Details  ciprofloxacin (CIPRO) 500 MG tablet Take 1 tablet (500 mg total) by mouth 2 (two) times daily. Qty: 6 tablet, Refills: 0    HYDROcodone-acetaminophen (NORCO/VICODIN) 5-325 MG tablet Take 1-2 tablets by mouth every 6 (six) hours as needed for moderate pain. Qty: 30 tablet, Refills: 0      CONTINUE these medications which have CHANGED   Details  lisinopril (PRINIVIL,ZESTRIL) 5 MG tablet Take 2 tablets (10 mg total) by mouth daily. Qty: 30 tablet, Refills: 2      CONTINUE these medications which have NOT CHANGED   Details  Biotin 1000 MCG tablet Take 1,000 mcg by mouth daily.    cholecalciferol (VITAMIN D) 1000 UNITS tablet Take 1,000 Units by mouth daily.    clonazePAM (KLONOPIN) 0.25 MG disintegrating tablet Take 0.25 mg by mouth at bedtime.    cyclobenzaprine (FLEXERIL) 5 MG tablet Take 10 mg by mouth 2 (two) times daily.    furosemide (LASIX) 20 MG tablet Take 20 mg by mouth daily.    loratadine (CLARITIN) 10 MG tablet Take 10 mg by mouth daily as needed for allergies.    metoprolol tartrate (LOPRESSOR) 25 MG tablet Take 25 mg by mouth 2 (two) times daily.    omeprazole (PRILOSEC) 20 MG capsule Take 20 mg by mouth daily.    potassium chloride (K-DUR) 10 MEQ tablet Take 10 mEq by mouth  daily.    predniSONE (DELTASONE) 5 MG tablet Take 7.5 mg by mouth daily.    vitamin B-12 (CYANOCOBALAMIN) 1000 MCG tablet Take 1,000 mcg by mouth daily.        If you experience worsening of your admission symptoms, develop shortness of breath, life threatening emergency, suicidal or homicidal thoughts you must seek medical attention immediately by calling 911 or calling your MD immediately   if symptoms less severe.  You Must read complete instructions/literature along with all the possible adverse reactions/side effects for all the Medicines you take and that have been prescribed to you. Take any new Medicines after you have completely understood and accept all the possible adverse reactions/side effects.   Please note  You were cared for by a hospitalist during your hospital stay. If you have any questions about your discharge medications or the care you received while you were in the hospital after you are discharged, you can call the unit and asked to speak with the hospitalist on call if the hospitalist that took care of you is not available. Once you are discharged, your primary care physician will handle any further medical issues. Please note that NO REFILLS for any discharge medications will be authorized once you are discharged, as it is imperative that you return to your primary care physician (or establish a relationship with a primary care physician if you do not have one) for your aftercare needs so that they can reassess your need for medications and monitor your lab values. Today   SUBJECTIVE   Feels a lot better   VITAL SIGNS:  Blood pressure 138/67, pulse 92, temperature 97.4 F (36.3 C), temperature source Oral, resp. rate 18, height 5' 2.5" (1.588 m), weight 154 lb 6.4 oz (70.035 kg), SpO2 100 %.  I/O:   Intake/Output Summary (Last 24 hours) at 09/28/15 0735 Last data filed at 09/27/15 1800  Gross per 24 hour  Intake    120 ml  Output      0 ml  Net    120 ml    PHYSICAL EXAMINATION:  GENERAL:  74 y.o.-year-old patient lying in the bed with no acute distress.  EYES: Pupils equal, round, reactive to light and accommodation. No scleral icterus. Extraocular muscles intact.  HEENT: Head atraumatic, normocephalic. Oropharynx and nasopharynx clear.  NECK:  Supple, no jugular venous distention. No thyroid enlargement, no tenderness.  LUNGS: Normal breath  sounds bilaterally, no wheezing, rales,rhonchi or crepitation. No use of accessory muscles of respiration.  CARDIOVASCULAR: S1, S2 normal. No murmurs, rubs, or gallops.  ABDOMEN: Soft, non-tender, non-distended. Bowel sounds present. No organomegaly or mass.  EXTREMITIES: No pedal edema, cyanosis, or clubbing.  NEUROLOGIC: Cranial nerves II through XII are intact. Muscle strength 5/5 in all extremities. Sensation intact. Gait not checked.  PSYCHIATRIC: The patient is alert and oriented x 3.  SKIN: No obvious rash, lesion, or ulcer.   DATA REVIEW:   CBC   Recent Labs Lab 09/26/15 1651  WBC 15.2*  HGB 15.0  HCT 46.8  PLT 305    Chemistries   Recent Labs Lab 09/28/15 0517  NA 142  K 3.8  CL 116*  CO2 17*  GLUCOSE 79  BUN 48*  CREATININE 1.34*  CALCIUM 8.2*    Microbiology Results   Recent Results (from the past 240 hour(s))  MRSA PCR Screening     Status: None   Collection Time: 09/27/15 12:30 AM  Result Value Ref Range Status   MRSA by PCR NEGATIVE  NEGATIVE Final    Comment:        The GeneXpert MRSA Assay (FDA approved for NASAL specimens only), is one component of a comprehensive MRSA colonization surveillance program. It is not intended to diagnose MRSA infection nor to guide or monitor treatment for MRSA infections.     RADIOLOGY:  [place] Renal  09/27/2015  CLINICAL DATA:  Acute renal failure. EXAM: RENAL / URINARY TRACT ULTRASOUND COMPLETE COMPARISON:  None. FINDINGS: Right Kidney: Length: 9.1 cm. 2.1 cm cyst is noted in midpole. Echogenicity within normal limits. No mass or hydronephrosis visualized. Left Kidney: Length: 9.1 cm. 1.4 cm cyst is noted in upper pole. Echogenicity within normal limits. No mass or hydronephrosis visualized. Bladder: Appears normal for degree of bladder distention. Bilateral ureteral jets are noted. IMPRESSION: Small bilateral simple renal cysts. No other significant abnormality seen in the kidneys. Electronically Signed   By:  Lupita RaiderJames  Green Jr, M.D.   On: 09/27/2015 10:44     Management plans discussed with the patient, family and they are in agreement.  CODE STATUS:     Code Status Orders        Start     Ordered   09/26/15 2007  Full code   Continuous     09/26/15 2009      TOTAL TIME TAKING CARE OF THIS PATIENT: 40 minutes.    Bexlee Bergdoll M.D on 09/28/2015 at 7:35 AM  Between 7am to 6pm - Pager - [phone] After 6pm go to [email or web address] - password [email or web address] [email or web address]  [facility] [place]  Office  [phone]  CC: Primary care physician; Lorenda IshiharaVaradarajan, Rupashree

## 2015-09-28 NOTE — Progress Notes (Signed)
Pharmacy Note - rx clarification  Called [facility] to clarify outpatient prescriptions. Per Dr. Allena KatzPatel, cancelled prescription for ciprofloxacin, changed prescription to cephalexin 500mg  PO BID x 5 days.  Garlon HatchetJody Brigido Mera, PharmD Clinical Pharmacist  09/28/2015 12:17 PM

## 2015-09-30 LAB — URINE CULTURE: Culture: 80000

## 2015-12-26 ENCOUNTER — Inpatient Hospital Stay (HOSPITAL_COMMUNITY): Payer: Medicare Other

## 2015-12-26 ENCOUNTER — Ambulatory Visit (HOSPITAL_COMMUNITY): Admission: EM | Admit: 2015-12-26 | Payer: Medicare Other | Source: Ambulatory Visit | Admitting: Cardiology

## 2015-12-26 ENCOUNTER — Encounter (HOSPITAL_COMMUNITY): Payer: Self-pay | Admitting: Cardiology

## 2015-12-26 ENCOUNTER — Emergency Department (HOSPITAL_COMMUNITY): Payer: Medicare Other

## 2015-12-26 ENCOUNTER — Encounter (HOSPITAL_COMMUNITY): Admission: EM | Disposition: E | Payer: Self-pay | Source: Home / Self Care | Attending: Cardiology

## 2015-12-26 DIAGNOSIS — I748 Embolism and thrombosis of other arteries: Secondary | ICD-10-CM | POA: Diagnosis present

## 2015-12-26 DIAGNOSIS — I213 ST elevation (STEMI) myocardial infarction of unspecified site: Secondary | ICD-10-CM | POA: Diagnosis present

## 2015-12-26 DIAGNOSIS — Z7982 Long term (current) use of aspirin: Secondary | ICD-10-CM

## 2015-12-26 DIAGNOSIS — Z7952 Long term (current) use of systemic steroids: Secondary | ICD-10-CM

## 2015-12-26 DIAGNOSIS — R55 Syncope and collapse: Secondary | ICD-10-CM

## 2015-12-26 DIAGNOSIS — I4891 Unspecified atrial fibrillation: Secondary | ICD-10-CM

## 2015-12-26 DIAGNOSIS — G8929 Other chronic pain: Secondary | ICD-10-CM | POA: Diagnosis present

## 2015-12-26 DIAGNOSIS — Z91013 Allergy to seafood: Secondary | ICD-10-CM

## 2015-12-26 DIAGNOSIS — E876 Hypokalemia: Secondary | ICD-10-CM | POA: Diagnosis present

## 2015-12-26 DIAGNOSIS — R0600 Dyspnea, unspecified: Secondary | ICD-10-CM

## 2015-12-26 DIAGNOSIS — I2109 ST elevation (STEMI) myocardial infarction involving other coronary artery of anterior wall: Secondary | ICD-10-CM | POA: Diagnosis not present

## 2015-12-26 DIAGNOSIS — I5181 Takotsubo syndrome: Secondary | ICD-10-CM | POA: Diagnosis not present

## 2015-12-26 DIAGNOSIS — E785 Hyperlipidemia, unspecified: Secondary | ICD-10-CM | POA: Diagnosis present

## 2015-12-26 DIAGNOSIS — Z79899 Other long term (current) drug therapy: Secondary | ICD-10-CM

## 2015-12-26 DIAGNOSIS — I10 Essential (primary) hypertension: Secondary | ICD-10-CM

## 2015-12-26 DIAGNOSIS — I119 Hypertensive heart disease without heart failure: Secondary | ICD-10-CM | POA: Diagnosis present

## 2015-12-26 DIAGNOSIS — M316 Other giant cell arteritis: Secondary | ICD-10-CM | POA: Diagnosis present

## 2015-12-26 DIAGNOSIS — R410 Disorientation, unspecified: Secondary | ICD-10-CM | POA: Diagnosis present

## 2015-12-26 DIAGNOSIS — I70201 Unspecified atherosclerosis of native arteries of extremities, right leg: Secondary | ICD-10-CM | POA: Diagnosis present

## 2015-12-26 DIAGNOSIS — I429 Cardiomyopathy, unspecified: Secondary | ICD-10-CM

## 2015-12-26 DIAGNOSIS — M549 Dorsalgia, unspecified: Secondary | ICD-10-CM | POA: Diagnosis present

## 2015-12-26 HISTORY — PX: CARDIAC CATHETERIZATION: SHX172

## 2015-12-26 LAB — POCT I-STAT TROPONIN I: TROPONIN I, POC: 0.34 ng/mL — AB (ref 0.00–0.08)

## 2015-12-26 LAB — BASIC METABOLIC PANEL
Anion gap: 14 (ref 5–15)
BUN: 13 mg/dL (ref 6–20)
CHLORIDE: 111 mmol/L (ref 101–111)
CO2: 19 mmol/L — ABNORMAL LOW (ref 22–32)
CREATININE: 0.74 mg/dL (ref 0.44–1.00)
Calcium: 6.4 mg/dL — CL (ref 8.9–10.3)
GFR calc Af Amer: >60 mL/min (ref >60)
Glucose, Bld: 122 mg/dL — ABNORMAL HIGH (ref 65–99)
Potassium: 3.7 mmol/L (ref 3.5–5.1)
SODIUM: 144 mmol/L (ref 135–145)

## 2015-12-26 LAB — COMPREHENSIVE METABOLIC PANEL
ALT: 14 U/L (ref 14–54)
ANION GAP: 17 — AB (ref 5–15)
AST: 37 U/L (ref 15–41)
Albumin: 2.5 g/dL — ABNORMAL LOW (ref 3.5–5.0)
Alkaline Phosphatase: 51 U/L (ref 38–126)
BUN: 12 mg/dL (ref 6–20)
CHLORIDE: 108 mmol/L (ref 101–111)
CO2: 20 mmol/L — ABNORMAL LOW (ref 22–32)
Calcium: 6.5 mg/dL — ABNORMAL LOW (ref 8.9–10.3)
Creatinine, Ser: 0.82 mg/dL (ref 0.44–1.00)
GFR calc Af Amer: >60 mL/min (ref >60)
Glucose, Bld: 204 mg/dL — ABNORMAL HIGH (ref 65–99)
POTASSIUM: 2.8 mmol/L — AB (ref 3.5–5.1)
Sodium: 145 mmol/L (ref 135–145)
TOTAL PROTEIN: 5.2 g/dL — AB (ref 6.5–8.1)
Total Bilirubin: 0.3 mg/dL (ref 0.3–1.2)

## 2015-12-26 LAB — I-STAT CHEM 8, ED
BUN: 13 mg/dL (ref 6–20)
CREATININE: 0.7 mg/dL (ref 0.44–1.00)
Calcium, Ion: 0.76 mmol/L — ABNORMAL LOW (ref 1.13–1.30)
Chloride: 105 mmol/L (ref 101–111)
Glucose, Bld: 197 mg/dL — ABNORMAL HIGH (ref 65–99)
HEMATOCRIT: 44 % (ref 36.0–46.0)
HEMOGLOBIN: 15 g/dL (ref 12.0–15.0)
POTASSIUM: 2.6 mmol/L — AB (ref 3.5–5.1)
SODIUM: 144 mmol/L (ref 135–145)
TCO2: 19 mmol/L (ref 0–100)

## 2015-12-26 LAB — CBC
HCT: 41.1 % (ref 36.0–46.0)
Hemoglobin: 13.1 g/dL (ref 12.0–15.0)
MCH: 30.5 pg (ref 26.0–34.0)
MCHC: 31.9 g/dL (ref 30.0–36.0)
MCV: 95.8 fL (ref 78.0–100.0)
PLATELETS: 417 10*3/uL — AB (ref 150–400)
RBC: 4.29 MIL/uL (ref 3.87–5.11)
RDW: 14.6 % (ref 11.5–15.5)
WBC: 13.2 10*3/uL — AB (ref 4.0–10.5)

## 2015-12-26 LAB — TROPONIN I
TROPONIN I: 2.92 ng/mL — AB (ref <0.031)
TROPONIN I: 2.31 ng/mL — AB (ref <0.031)
TROPONIN I: 3.13 ng/mL — AB (ref <0.031)

## 2015-12-26 LAB — APTT: APTT: 24 s (ref 24–37)

## 2015-12-26 LAB — PROTIME-INR
INR: 1.11 (ref 0.00–1.49)
PROTHROMBIN TIME: 14.5 s (ref 11.6–15.2)

## 2015-12-26 SURGERY — LEFT HEART CATH AND CORONARY ANGIOGRAPHY
Anesthesia: LOCAL

## 2015-12-26 SURGERY — LEFT HEART CATH AND CORONARY ANGIOGRAPHY

## 2015-12-26 MED ORDER — HEPARIN BOLUS VIA INFUSION
3000.0000 [IU] | Freq: Once | INTRAVENOUS | Status: AC
  Administered 2015-12-26: 3000 [IU] via INTRAVENOUS
  Filled 2015-12-26: qty 3000

## 2015-12-26 MED ORDER — WARFARIN SODIUM 5 MG PO TABS
5.0000 mg | ORAL_TABLET | Freq: Once | ORAL | Status: AC
  Administered 2015-12-26: 5 mg via ORAL
  Filled 2015-12-26: qty 1

## 2015-12-26 MED ORDER — HEPARIN SODIUM (PORCINE) 1000 UNIT/ML IJ SOLN
INTRAMUSCULAR | Status: AC
  Filled 2015-12-26: qty 1

## 2015-12-26 MED ORDER — POTASSIUM CHLORIDE 10 MEQ/100ML IV SOLN
10.0000 meq | INTRAVENOUS | Status: AC
  Administered 2015-12-26 (×3): 10 meq via INTRAVENOUS
  Filled 2015-12-26 (×3): qty 100

## 2015-12-26 MED ORDER — VERAPAMIL HCL 2.5 MG/ML IV SOLN
INTRAVENOUS | Status: AC
  Filled 2015-12-26: qty 2

## 2015-12-26 MED ORDER — POTASSIUM CHLORIDE 10 MEQ/100ML IV SOLN
INTRAVENOUS | Status: DC | PRN
  Administered 2015-12-26: 10 meq via INTRAVENOUS

## 2015-12-26 MED ORDER — ONDANSETRON HCL 4 MG/2ML IJ SOLN
4.0000 mg | Freq: Four times a day (QID) | INTRAMUSCULAR | Status: DC | PRN
  Administered 2015-12-26: 4 mg via INTRAVENOUS
  Filled 2015-12-26: qty 2

## 2015-12-26 MED ORDER — ACETAMINOPHEN 325 MG PO TABS
650.0000 mg | ORAL_TABLET | ORAL | Status: DC | PRN
  Administered 2015-12-26: 650 mg via ORAL
  Filled 2015-12-26: qty 2

## 2015-12-26 MED ORDER — POTASSIUM CHLORIDE ER 10 MEQ PO TBCR
40.0000 meq | EXTENDED_RELEASE_TABLET | Freq: Every day | ORAL | Status: DC
  Administered 2015-12-27: 40 meq via ORAL
  Filled 2015-12-26 (×3): qty 4

## 2015-12-26 MED ORDER — SODIUM CHLORIDE 0.9% FLUSH: 3.0000 mL | INTRAVENOUS | Status: DC | PRN

## 2015-12-26 MED ORDER — HEPARIN (PORCINE) IN NACL 100-0.45 UNIT/ML-% IJ SOLN
800.0000 [IU]/h | INTRAMUSCULAR | Status: DC
  Administered 2015-12-26: 800 [IU]/h via INTRAVENOUS
  Filled 2015-12-26: qty 250

## 2015-12-26 MED ORDER — POTASSIUM CHLORIDE 20 MEQ/15ML (10%) PO SOLN
ORAL | Status: AC
  Filled 2015-12-26: qty 30

## 2015-12-26 MED ORDER — IOHEXOL 350 MG/ML SOLN
INTRAVENOUS | Status: DC | PRN
  Administered 2015-12-26: 105 mL via INTRA_ARTERIAL

## 2015-12-26 MED ORDER — POTASSIUM CHLORIDE 20 MEQ/15ML (10%) PO SOLN
40.0000 meq | Freq: Once | ORAL | Status: AC
  Administered 2015-12-26: 40 meq via ORAL
  Filled 2015-12-26: qty 30

## 2015-12-26 MED ORDER — SODIUM CHLORIDE 0.9 % IV SOLN: 250.0000 mL | INTRAVENOUS | Status: DC | PRN

## 2015-12-26 MED ORDER — LIDOCAINE HCL (PF) 1 % IJ SOLN
INTRAMUSCULAR | Status: DC | PRN
  Administered 2015-12-26: 15 mL
  Administered 2015-12-26: 5 mL

## 2015-12-26 MED ORDER — NITROGLYCERIN 1 MG/10 ML FOR IR/CATH LAB
INTRA_ARTERIAL | Status: AC
  Filled 2015-12-26: qty 10

## 2015-12-26 MED ORDER — LIDOCAINE HCL (PF) 1 % IJ SOLN
INTRAMUSCULAR | Status: AC
  Filled 2015-12-26: qty 30

## 2015-12-26 MED ORDER — VITAMIN D 1000 UNITS PO TABS
1000.0000 [IU] | ORAL_TABLET | Freq: Every day | ORAL | Status: DC
  Administered 2015-12-27: 1000 [IU] via ORAL
  Filled 2015-12-26: qty 1

## 2015-12-26 MED ORDER — PANTOPRAZOLE SODIUM 40 MG PO TBEC
40.0000 mg | DELAYED_RELEASE_TABLET | Freq: Every day | ORAL | Status: DC
  Administered 2015-12-27: 40 mg via ORAL
  Filled 2015-12-26: qty 1

## 2015-12-26 MED ORDER — SODIUM CHLORIDE 0.9 % WEIGHT BASED INFUSION
3.0000 mL/kg/h | INTRAVENOUS | Status: DC
  Administered 2015-12-26: 3 mL/kg/h via INTRAVENOUS

## 2015-12-26 MED ORDER — VITAMIN B-12 1000 MCG PO TABS
1000.0000 ug | ORAL_TABLET | Freq: Every day | ORAL | Status: DC
  Administered 2015-12-27: 1000 ug via ORAL
  Filled 2015-12-26: qty 1

## 2015-12-26 MED ORDER — SODIUM CHLORIDE 0.9% FLUSH
3.0000 mL | Freq: Two times a day (BID) | INTRAVENOUS | Status: DC
  Administered 2015-12-26 – 2015-12-27 (×3): 3 mL via INTRAVENOUS

## 2015-12-26 MED ORDER — WARFARIN - PHARMACIST DOSING INPATIENT
Freq: Every day | Status: DC
  Administered 2015-12-27: 18:00:00

## 2015-12-26 MED ORDER — POTASSIUM CHLORIDE 10 MEQ/100ML IV SOLN
INTRAVENOUS | Status: AC
  Filled 2015-12-26: qty 100

## 2015-12-26 MED ORDER — SODIUM CHLORIDE 0.9 % WEIGHT BASED INFUSION
3.0000 mL/kg/h | INTRAVENOUS | Status: AC
  Administered 2015-12-26: 3 mL/kg/h via INTRAVENOUS

## 2015-12-26 MED ORDER — METOPROLOL TARTRATE 25 MG PO TABS
25.0000 mg | ORAL_TABLET | Freq: Two times a day (BID) | ORAL | Status: DC
  Administered 2015-12-26 – 2015-12-27 (×2): 25 mg via ORAL
  Filled 2015-12-26 (×2): qty 1

## 2015-12-26 MED ORDER — SODIUM CHLORIDE 0.9 % IV SOLN: INTRAVENOUS | Status: DC

## 2015-12-26 MED ORDER — HEPARIN (PORCINE) IN NACL 2-0.9 UNIT/ML-% IJ SOLN
INTRAMUSCULAR | Status: AC
  Filled 2015-12-26: qty 1000

## 2015-12-26 SURGICAL SUPPLY — 12 items
CATH INFINITI 5FR MULTPACK ANG (CATHETERS) ×3 IMPLANT
CATH VISTA GUIDE 6FR XBLAD3.5 (CATHETERS) ×3 IMPLANT
ELECT DEFIB PAD ADLT CADENCE (PAD) ×3 IMPLANT
GLIDESHEATH SLEND A-KIT 6F 22G (SHEATH) ×3 IMPLANT
KIT ENCORE 26 ADVANTAGE (KITS) ×3 IMPLANT
KIT HEART LEFT (KITS) ×3 IMPLANT
PACK CARDIAC CATHETERIZATION (CUSTOM PROCEDURE TRAY) ×3 IMPLANT
SHEATH PINNACLE 6F 10CM (SHEATH) ×3 IMPLANT
SYR MEDRAD MARK V 150ML (SYRINGE) ×3 IMPLANT
TRANSDUCER W/STOPCOCK (MISCELLANEOUS) ×3 IMPLANT
TUBING CIL FLEX 10 FLL-RA (TUBING) ×3 IMPLANT
WIRE EMERALD 3MM-J .035X150CM (WIRE) ×3 IMPLANT

## 2015-12-26 NOTE — Progress Notes (Signed)
Pt confused to time, place, and situation. PO pills due. Pt refusing to take pills and demanding to be left alone. Will continue to monitor and assess pt closely.

## 2015-12-26 NOTE — ED Provider Notes (Signed)
CSN: [id number]     Arrival date & time December 28, 2015  0917 History    HPI Patient presented to the emergency room as a code STEMI activation from [facility]. Patient is a resident of an assisted living facility. According to the staff at the facility the patient initially woke up this morning and was fine. They checked on her a little later noted she was confused and combative.  EMS was contacted. Initially the patient was confused and combative but that has subsequently resolved. Patient is now awake and alert and she denies any complaints of any headache, weakness chest pain or abdominal pain. She does not remember the event this morning. EMS did an EKG per protocol and they noted acute ST elevation in the anterior leads.  Because of her initial confusion, the patient was held down in the emergency room for a CT evaluation of the brain. Plan is for her to go up to the cath lab following that. Past Medical History  Diagnosis Date  . Chronic back pain   . Pneumonia   . Temporal arteritis [facility])    Past Surgical History  Procedure Laterality Date  . Tubal ligation     No family history on file. Social History  Substance Use Topics  . Smoking status: Never Smoker   . Smokeless tobacco: Never Used  . Alcohol Use: 0.6 oz/week    1 Glasses of wine per week     Comment: Everyday   OB History    No data available     Review of Systems  All other systems reviewed and are negative.     Allergies  Shellfish allergy  Home Medications   Prior to Admission medications   Medication Sig Start Date End Date Taking? Authorizing Provider  Biotin 1000 MCG tablet Take 1,000 mcg by mouth daily.    Historical Provider, MD  cephALEXin (KEFLEX) 500 MG capsule Take 1 capsule (500 mg total) by mouth 4 (four) times daily. 09/28/15   Enedina Finner, MD  cholecalciferol (VITAMIN D) 1000 UNITS tablet Take 1,000 Units by mouth daily.    Historical Provider, MD  clonazePAM (KLONOPIN) 0.25 MG disintegrating  tablet Take 0.25 mg by mouth at bedtime.    Historical Provider, MD  cyclobenzaprine (FLEXERIL) 5 MG tablet Take 10 mg by mouth 2 (two) times daily.    Historical Provider, MD  furosemide (LASIX) 20 MG tablet Take 20 mg by mouth daily.    Historical Provider, MD  HYDROcodone-acetaminophen (NORCO/VICODIN) 5-325 MG tablet Take 1-2 tablets by mouth every 6 (six) hours as needed for moderate pain. 09/28/15   Enedina Finner, MD  lisinopril (PRINIVIL,ZESTRIL) 5 MG tablet Take 2 tablets (10 mg total) by mouth daily. 09/28/15   Enedina Finner, MD  loratadine (CLARITIN) 10 MG tablet Take 10 mg by mouth daily as needed for allergies.    Historical Provider, MD  metoprolol tartrate (LOPRESSOR) 25 MG tablet Take 25 mg by mouth 2 (two) times daily.    Historical Provider, MD  omeprazole (PRILOSEC) 20 MG capsule Take 20 mg by mouth daily.    Historical Provider, MD  potassium chloride (K-DUR) 10 MEQ tablet Take 10 mEq by mouth daily.    Historical Provider, MD  predniSONE (DELTASONE) 5 MG tablet Take 7.5 mg by mouth daily.    Historical Provider, MD  vitamin B-12 (CYANOCOBALAMIN) 1000 MCG tablet Take 1,000 mcg by mouth daily.    Historical Provider, MD   There were no vitals taken for this visit. Physical Exam  Constitutional: No distress.  HENT:  Head: Normocephalic and atraumatic.  Right Ear: External ear normal.  Left Ear: External ear normal.  Eyes: Conjunctivae are normal. Right eye exhibits no discharge. Left eye exhibits no discharge. No scleral icterus.  Neck: Neck supple. No tracheal deviation present.  Cardiovascular: Normal rate, regular rhythm and intact distal pulses.   Pulmonary/Chest: Effort normal and breath sounds normal. No stridor. No respiratory distress. She has no wheezes. She has no rales.  Abdominal: Soft. Bowel sounds are normal. She exhibits no distension. There is no tenderness. There is no rebound and no guarding.  Musculoskeletal: She exhibits no edema or tenderness.  Neurological:  She is alert. She has normal strength. No cranial nerve deficit (no facial droop, extraocular movements intact, no slurred speech) or sensory deficit. She exhibits normal muscle tone. She displays no seizure activity. Coordination normal.  Patient is able to move all 4 extremities  Skin: Skin is warm and dry. No rash noted. She is not diaphoretic.  Psychiatric: She has a normal mood and affect.  Nursing note and vitals reviewed.   ED Course  Procedures (including critical care time) Labs Review Labs Reviewed  APTT  CBC  COMPREHENSIVE METABOLIC PANEL  PROTIME-INR  I-STAT TROPOININ, ED  I-STAT CHEM 8, ED    Imaging Review No results found. I have personally reviewed and evaluated these images and lab results as part of my medical decision-making.  EMS EKG demonstrates acute ST elevation in the anterior leads.  MDM   Final diagnoses:  ST elevation myocardial infarction (STEMI), unspecified artery (HCC)  Syncope, unspecified syncope type   I evaluated the patient quickly at the triage bay. Discussed the case with Dr. Dorethea Clan. Because of this episode of confusion initially we will get a head CT first. As long as there are no acute findings there plan on having her go to the cardiac catheterization lab.  Patient remains without complaints of headache. She also is not having any difficulty with chest pain or shortness of breath.   Linwood Dibbles, MD Jan 16, 2016 2102

## 2015-12-26 NOTE — H&P (Signed)
History and Physical Interval Note:  NAME:  Kerri Carroll   MRN: [id number] DOB:  [birth year]   ADMIT DATE: 12/12/2015   12/15/2015 10:21 AM  Kerri Carroll is a 75 y.o. female with a history of maybe hypertension and previously admitted for hypokalemia makes anemia and dehydration with acute renal failure in the setting of UTI back in November 2016. Her creatinine had improved to 1.3 for the final discharge. She had recently moved into an assisted living facility. She was in her usual state of health until his morning she is noted to be somewhat confused after the initial evaluation. There was some reported potential seizure activity. Upon EMS evaluation she was somewhat confused and combative, however however that resolved spontaneously on route to the ER. She is noted to have ST elevations in leads V2 and V3 consistent with anterior ST elevation MI. She was brought to the [facility] emergency room. Upon arrival to the written emergency room she was in a more clear mental status state, however with the concerning potential initial presentation of seizure activity, she was taken for a stat head CT which has delayed her I would like cardiac catheterization lab. I discussed the results of the head CT with radiology and they both confirmed that there was no evidence of a head bleed or mass lesion.   Past Medical History  Diagnosis Date  . Chronic back pain   . Pneumonia   . Temporal arteritis [facility])    Past Surgical History  Procedure Laterality Date  . Tubal ligation      FAMHx:   the patient does not recall any family history of premature  CAD. She is not the greatest historians and therefore is unable to provide much detail. No family history on file.  SOCHx:  reports that she has never smoked. She has never used smokeless tobacco. She reports that she drinks about 0.6 oz of alcohol per week. She reports that she does not use illicit drugs.  ALLERGIES: Allergies  Allergen Reactions  .  Shellfish Allergy Other (See Comments)    Reaction:  Makes pt sweaty     HOME MEDICATIONS: Prescriptions prior to admission  Medication Sig Dispense Refill Last Dose  . Biotin 1000 MCG tablet Take 1,000 mcg by mouth daily.   09/25/2015 at Unknown time  . cephALEXin (KEFLEX) 500 MG capsule Take 1 capsule (500 mg total) by mouth 4 (four) times daily. 10 capsule 0   . cholecalciferol (VITAMIN D) 1000 UNITS tablet Take 1,000 Units by mouth daily.   09/25/2015 at Unknown time  . clonazePAM (KLONOPIN) 0.25 MG disintegrating tablet Take 0.25 mg by mouth at bedtime.   09/25/2015 at Unknown time  . cyclobenzaprine (FLEXERIL) 5 MG tablet Take 10 mg by mouth 2 (two) times daily.   09/26/2015 at Unknown time  . furosemide (LASIX) 20 MG tablet Take 20 mg by mouth daily.   09/25/2015 at Unknown time  . HYDROcodone-acetaminophen (NORCO/VICODIN) 5-325 MG tablet Take 1-2 tablets by mouth every 6 (six) hours as needed for moderate pain. 30 tablet 0   . lisinopril (PRINIVIL,ZESTRIL) 5 MG tablet Take 2 tablets (10 mg total) by mouth daily. 30 tablet 2   . lisinopril (PRINIVIL,ZESTRIL) 5 MG tablet Take 5 mg by mouth daily.     Marland Kitchen loratadine (CLARITIN) 10 MG tablet Take 10 mg by mouth daily as needed for allergies.   Past Month at Unknown time  . metoprolol tartrate (LOPRESSOR) 25 MG tablet Take 25 mg by mouth 2 (  two) times daily.   09/26/2015 at Unsure  . omeprazole (PRILOSEC) 20 MG capsule Take 20 mg by mouth daily.   09/26/2015 at Unknown time  . potassium chloride (K-DUR) 10 MEQ tablet Take 10 mEq by mouth daily.   09/26/2015 at Unknown time  . predniSONE (DELTASONE) 5 MG tablet Take 7.5 mg by mouth daily.   09/26/2015 at Unknown time  . vitamin B-12 (CYANOCOBALAMIN) 1000 MCG tablet Take 1,000 mcg by mouth daily.   09/25/2015 at Unknown time   Unable to perform review of systems due to the emergency nature of the procedure. Will be performed post cath - she denies any dysuria, hematuria or fever/chills Review  of Systems  Unable to perform ROS: mental acuity  Constitutional: Negative for fever and chills.  Genitourinary: Negative for dysuria, urgency, hematuria and flank pain.  Psychiatric/Behavioral: Positive for memory loss. The patient is nervous/anxious (She is very anxious today). The patient does not have insomnia.   All other systems reviewed and are negative.  PHYSICAL EXAM:There were no vitals taken for this visit.  BP IN CATH LAB 121/78 mmHg, HR ~103-115, no fever General appearance: alert, cooperative, appears stated age and mildly confused - but was not tole what facility she was coming to. No acitve CP or SOB HHENT: [place]/AT.  L> R pupil - dilated, round & reactive. EOMI.   Neck: no adenopathy, no carotid bruit and no JVD Lungs: clear to auscultation bilaterally, normal percussion bilaterally and anterior lung fields Heart: Rapid, Irreg, Irreg with no obvious M/R/G Abdomen: soft, non-tender; bowel sounds normal; no masses,  no organomegaly Extremities: extremities normal, atraumatic, no cyanosis or edema Pulses: 2+ and symmetric Skin: Skin color, texture, turgor normal. No rashes or lesions or pale Neurologic: Mental status: Mildly confused as to location - not sure where she came. Knowns she is in ER Cranial nerves: normal   Adult ECG Report Afib 123, ~3-4 mm STE in V2 & V3 with minimal reciprocal changes.   Narrative Interpretation: Acute Anterior STEMI, Afib  IMPRESSION & PLAN The patients' history has been reviewed, patient examined, no change in status from most recent note, stable for surgery. I have reviewed the patients' chart and labs. Questions were answered to the patient's satisfaction.    [name] has presented today for EMERGENT CARDIAC CATH FOR ANTERIOR STEMI, with the diagnosis of chest pain The various methods of treatment have been discussed with the patient and family.   After consideration of risks, benefits and other options for treatment, the patient  has consented to Procedure(s):  LEFT HEART CATHETERIZATION AND CORONARY ANGIOGRAPHY +/- AD HOC PERCUTANEOUS CORONARY INTERVENTION  as a surgical intervention.   We will proceed with the planned procedure.  She is noted to be hypokalemic upon arrival with a potassium 2.6. She'll be repleted in the Cath Lab and upon arrival to the ICU.  Full additional details will be completed following cardiac catheterization.  She is also noted to be in atrial fibrillation, she thinks she may have had age of ablation in the past, but was not on any anticoagulation for it. She recalls being on metoprolol past.  Marykay Lex, M.D., M.S. Interventional Cardiologist   Pager # [phone] Phone # [phone] [address] [place], [place] [address]

## 2015-12-26 NOTE — ED Notes (Signed)
Pt arrived to ED as STEMI. EDP, STEMI coordinator, and Cardiology (via phone) deciding whether pt to have ct head prior to going to cath lab. Pt sent to ct scanner then to cath lab with RN, STEMI coordinator, EMS. Pt alert and oriented. No stroke symptoms. Denies CP. Nitro paste placed pta

## 2015-12-26 NOTE — Progress Notes (Signed)
  Echocardiogram 2D Echocardiogram has been performed.  Arvil Chaco 01-23-16, 4:19 PM

## 2015-12-27 ENCOUNTER — Inpatient Hospital Stay (HOSPITAL_COMMUNITY): Payer: Medicare Other | Admitting: Certified Registered Nurse Anesthetist

## 2015-12-27 ENCOUNTER — Inpatient Hospital Stay (HOSPITAL_COMMUNITY): Payer: Medicare Other

## 2015-12-27 DIAGNOSIS — I4891 Unspecified atrial fibrillation: Secondary | ICD-10-CM

## 2015-12-27 DIAGNOSIS — I5181 Takotsubo syndrome: Secondary | ICD-10-CM

## 2015-12-27 LAB — CBC
HEMATOCRIT: 37.9 % (ref 36.0–46.0)
HEMOGLOBIN: 12.3 g/dL (ref 12.0–15.0)
MCH: 31 pg (ref 26.0–34.0)
MCHC: 32.5 g/dL (ref 30.0–36.0)
MCV: 95.5 fL (ref 78.0–100.0)
Platelets: 326 10*3/uL (ref 150–400)
RBC: 3.97 MIL/uL (ref 3.87–5.11)
RDW: 14.5 % (ref 11.5–15.5)
WBC: 10.2 10*3/uL (ref 4.0–10.5)

## 2015-12-27 LAB — LIPID PANEL
CHOL/HDL RATIO: 4.4 ratio
CHOLESTEROL: 169 mg/dL (ref 0–200)
HDL: 38 mg/dL — ABNORMAL LOW (ref >40)
LDL CALC: 107 mg/dL — AB (ref 0–99)
Triglycerides: 122 mg/dL (ref <150)
VLDL: 24 mg/dL (ref 0–40)

## 2015-12-27 LAB — PROTIME-INR
INR: 1.53 — AB (ref 0.00–1.49)
Prothrombin Time: 18.4 seconds — ABNORMAL HIGH (ref 11.6–15.2)

## 2015-12-27 LAB — BASIC METABOLIC PANEL
Anion gap: 13 (ref 5–15)
BUN: 13 mg/dL (ref 6–20)
CHLORIDE: 111 mmol/L (ref 101–111)
CO2: 20 mmol/L — AB (ref 22–32)
CREATININE: 0.85 mg/dL (ref 0.44–1.00)
Calcium: 6.4 mg/dL — CL (ref 8.9–10.3)
GFR calc Af Amer: >60 mL/min (ref >60)
GFR calc non Af Amer: >60 mL/min (ref >60)
Glucose, Bld: 90 mg/dL (ref 65–99)
POTASSIUM: 3.9 mmol/L (ref 3.5–5.1)
SODIUM: 144 mmol/L (ref 135–145)

## 2015-12-27 LAB — HEPARIN LEVEL (UNFRACTIONATED)
HEPARIN UNFRACTIONATED: 1.17 [IU]/mL — AB (ref 0.30–0.70)
Heparin Unfractionated: 1.38 IU/mL — ABNORMAL HIGH (ref 0.30–0.70)
Heparin Unfractionated: <0.1 IU/mL — ABNORMAL LOW (ref 0.30–0.70)

## 2015-12-27 LAB — PHOSPHORUS: Phosphorus: 4.1 mg/dL (ref 2.5–4.6)

## 2015-12-27 LAB — MRSA PCR SCREENING: MRSA BY PCR: NEGATIVE

## 2015-12-27 MED ORDER — HEPARIN (PORCINE) IN NACL 100-0.45 UNIT/ML-% IJ SOLN: 750.0000 [IU]/h | INTRAMUSCULAR | Status: DC

## 2015-12-27 MED ORDER — SODIUM CHLORIDE 0.9 % IV SOLN
1.0000 g | Freq: Once | INTRAVENOUS | Status: AC
  Administered 2015-12-27: 1 g via INTRAVENOUS
  Filled 2015-12-27: qty 10

## 2015-12-27 MED ORDER — WARFARIN SODIUM 2.5 MG PO TABS
2.5000 mg | ORAL_TABLET | Freq: Once | ORAL | Status: AC
  Administered 2015-12-27: 2.5 mg via ORAL
  Filled 2015-12-27: qty 1

## 2015-12-27 MED ORDER — EPINEPHRINE HCL 0.1 MG/ML IJ SOSY
PREFILLED_SYRINGE | INTRAMUSCULAR | Status: DC
Start: 2015-12-27 — End: 2015-12-28
  Filled 2015-12-27: qty 10

## 2015-12-27 MED ORDER — EPINEPHRINE HCL 0.1 MG/ML IJ SOSY
PREFILLED_SYRINGE | INTRAMUSCULAR | Status: AC
  Filled 2015-12-27: qty 40

## 2015-12-27 MED ORDER — EPINEPHRINE HCL 0.1 MG/ML IJ SOSY
PREFILLED_SYRINGE | INTRAMUSCULAR | Status: AC
  Filled 2015-12-27: qty 30

## 2015-12-27 MED ORDER — EPINEPHRINE HCL 1 MG/ML IJ SOLN
INTRAMUSCULAR | Status: AC
  Filled 2015-12-27: qty 1

## 2015-12-27 MED FILL — Verapamil HCl IV Soln 2.5 MG/ML: INTRAVENOUS | Qty: 2 | Status: AC

## 2015-12-27 MED FILL — Heparin Sodium (Porcine) Inj 1000 Unit/ML: INTRAMUSCULAR | Qty: 30 | Status: AC

## 2015-12-27 MED FILL — Heparin Sodium (Porcine) 2 Unit/ML in Sodium Chloride 0.9%: INTRAMUSCULAR | Qty: 1000 | Status: AC

## 2015-12-27 MED FILL — Nitroglycerin IV Soln 100 MCG/ML in D5W: INTRA_ARTERIAL | Qty: 10 | Status: AC

## 2015-12-28 MED FILL — Medication: Qty: 1 | Status: AC

## 2015-12-28 NOTE — Progress Notes (Signed)
CRITICAL VALUE ALERT  Critical value received: Calcium 6.4 Date of notification: 12/14/2015 Nurse who received alert:  Nelly Laurence MD notified (1st page ):  Dr. Zachery Conch

## 2015-12-28 NOTE — Progress Notes (Signed)
SUBJECTIVE: not sure where she is, bothered by the BP cuff. Denies any cp/sob or other complaints.   BP 100/52 mmHg  Pulse 93  Temp(Src) 97.5 F (36.4 C) (Oral)  Resp 23  Ht  (1.6 m)  Wt 58.6 kg (129 lb 3 oz)  BMI 22.89 kg/m2  SpO2 98%  Intake/Output Summary (Last 24 hours) at 2015-12-30 0746 Last data filed at 2015-12-30 0300  Gross per 24 hour  Intake 1261.15 ml  Output      0 ml  Net 1261.15 ml    PHYSICAL EXAM General: thin appearing female, oriented to herself only.  Psych:  Can't respond to all questions appropriately.  Neck: No JVD. No masses noted.  Lungs: Clear bilaterally with no wheezes or rhonci noted.  Heart: RRR with no murmurs noted. Abdomen: Bowel sounds are present. Soft, non-tender.  Extremities: No lower extremity edema.   LABS: Basic Metabolic Panel:  Recent Labs  16/10/96 1142 30-Dec-2015 0245  NA 144 144  K 3.7 3.9  CL 111 111  CO2 19* 20*  GLUCOSE 122* 90  BUN 13 13  CREATININE 0.74 0.85  CALCIUM 6.4* 6.4*   CBC:  Recent Labs  12/23/2015 0916 12/21/2015 0924 12-30-2015 0245  WBC 13.2*  --  10.2  HGB 13.1 15.0 12.3  HCT 41.1 44.0 37.9  MCV 95.8  --  95.5  PLT 417*  --  326   Cardiac Enzymes:  Recent Labs  12/24/2015 1142 12/19/2015 1801 12/18/2015 2258  TROPONINI 2.92* 2.31* 3.13*   Fasting Lipid Panel:  Recent Labs  12/30/2015 0245  CHOL 169  HDL 38*  LDLCALC 107*  TRIG 122  CHOLHDL 4.4    Current Meds: . cholecalciferol  1,000 Units Oral Daily  . metoprolol tartrate  25 mg Oral BID  . pantoprazole  40 mg Oral Daily  . potassium chloride  40 mEq Oral Daily  . sodium chloride flush  3 mL Intravenous Q12H  . vitamin B-12  1,000 mcg Oral Daily  . Warfarin - Pharmacist Dosing Inpatient   Does not apply q1800     ASSESSMENT AND PLAN:  Principal Problem:   Takotsubo cardiomyopathy Active Problems:   Essential hypertension   Hypokalemia   ST elevation myocardial infarction (STEMI) (HCC) - ST elevations on EKG but  angiographically normal coronary arteries   Atrial fibrillation with RVR (HCC)   Femoral artery occlusion, right (HCC) - noted on right femoral angiogram   Atrial fibrillation with rapid ventricular response (HCC)    75 yo female with hx of Afib, HTN, chronic back pain, who presented from nursing home with confusion with ?seizure like activities, found to have ST elevation on V2/V3 went to cath lab emergently after negative head CT. Cath revealed normal coronary arteries, but left ventricular systolic dysfunction in Takotsubo pattern. ECHO also confirms this with mod-severely reduced EF 30-35% with dyskinesis of apicalanteroseptal, naterior, anterolateral, lateral, inferolateral, inferior, and inferoseptal myocardium. Basal segment was normal, had appearance of apical ballooning. Highly suggestive of Takotsubo cardiomyopathy but severe multivessel ischemia can also cause similar pattern.   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 She is hemodynamically stable so we would support her with bblocker, continue heparin as there is risk of clot formation in severely reduced EF. - cont coumadin, bridge with heparin, continue metoprolol.    - repeat ECHO in few weeks outpatient.   Right common femoral artery occlusion - seen on LHC. - cont  to assess of ischemia on exam. Denies any pain on her legs. Consult vascular surgery if there is acute changes, otherwise f/up outpatient.   AMS - not sure what her true baseline is. This could be 2/2 to metabolic dysfunction (hypocalcemia), also could be infectious (UA pending, CXR poor quality). CT head negative.  - continue neuro checks. Replete electrolytes and monitor for improvement.  HLD - consider starting lipitor  Hypokalemia - replete PRN  Hypocalcemia - replete PRN   Hyacinth Meeker  Mar 18, 20177:46 AM  Patient seen and examined. Agree with assessment and plan. Pt seen earlier this am and again  tonight.  Cath data reviewed.  Emergent cath done yesterday with Anterior ST elevation; normal coronaries without obstructive disease. Findings suggestive of Takotsubo cardiomyopathy with EF 30-35% and mid-distal anterior and apical distal inferior wall motion abnormality. Initial ST elevation has evolved to T wave inversion on todays ECG. No recurrent chest pain. Probably can transfer to stepdown, HR increased; will change metoprolol to coreg 3.125 mg bid and titrate and add low dose ACE-I. F/U ECG    Lennette Bihari, MD, [facility] 01/14/2016 7:04 PM

## 2015-12-28 NOTE — Progress Notes (Signed)
ANTICOAGULATION CONSULT NOTE - Follow Up Consult  Pharmacy Consult for Heparin  Indication: S/P Cath  Allergies  Allergen Reactions  . Shellfish Allergy Nausea And Vomiting    Patient Measurements: Height:  (160 cm) Weight: 129 lb 3 oz (58.6 kg) IBW/kg (Calculated) : 52.4  Vital Signs: Temp: 98.2 F (36.8 C) (02/28 1735) Temp Source: Oral (02/28 1735) BP: 125/91 mmHg (02/28 1735)  Labs:  Recent Labs  2016-01-20 0916 Jan 20, 2016 0924 01/20/2016 1142 01-20-2016 1801 01/20/16 2258 12/07/2015 0245 12/25/2015 0520 12/07/2015 1500  HGB 13.1 15.0  --   --   --  12.3  --   --   HCT 41.1 44.0  --   --   --  37.9  --   --   PLT 417*  --   --   --   --  326  --   --   APTT 24  --   --   --   --   --   --   --   LABPROT 14.5  --   --   --   --  18.4*  --   --   INR 1.11  --   --   --   --  1.53*  --   --   HEPARINUNFRC  --   --   --   --   --  1.38* 1.17* <0.10*  CREATININE 0.82 0.70 0.74  --   --  0.85  --   --   TROPONINI  --   --  2.92* 2.31* 3.13*  --   --   --     Estimated Creatinine Clearance: 48 mL/min (by C-G formula based on Cr of 0.85).  Assessment: Heparin level undetectable on 650 units/hr. Per RN, no interruption for infusion, but held infusion for 1 minute before drawing heparin level (can only drawn in the same arm for infusion). No bleeding noted.  Goal of Therapy:  Heparin level 0.3-0.7 units/ml Monitor platelets by anticoagulation protocol: Yes   Plan:  -Increase heparin to 750 units/hr -0200 HL  Bayard Hugger, PharmD, BCPS  Clinical Pharmacist  Pager: [phone]   12/10/2015,5:44 PM

## 2015-12-28 NOTE — Progress Notes (Signed)
CRITICAL VALUE ALERT  Critical value received:  Troponin 3.13  Date of notification: 12/19/2015  Critical value read back:Yes.    Nurse who received alert:  Nelly Laurence  MD notified (1st page):  Dr. Zachery Conch

## 2015-12-28 NOTE — Code Documentation (Signed)
  Patient Name: Kerri Carroll   MRN: [id number]   Date of Birth/ Sex: 1940/10/30 , female      Admission Date: 12/16/2015  Attending Provider: Marykay Lex, MD  Primary Diagnosis: ST elevation myocardial infarction (STEMI), unspecified artery (HCC) [I21.3] Syncope, unspecified syncope type [R55]   Indication: Pt was in her usual state of health until this PM, when she was noted to be in asystole and then PEA. Code blue was subsequently called. At the time of arrival on scene, ACLS protocol was underway.    Technical Description:  - CPR performance duration:  21  minutes  - Was defibrillation or cardioversion used? No   - Was external pacer placed? No  - Was patient intubated pre/post CPR? Yes   Medications Administered: Y = Yes; Blank = No Amiodarone    Atropine    Calcium    Epinephrine  Y  Lidocaine    Magnesium  Y  Norepinephrine    Phenylephrine    Sodium bicarbonate  Y  Vasopressin    Other    Post CPR evaluation:  - Final Status - Was patient successfully resuscitated ? No   Miscellaneous Information:  - Time of death:  09:12  PM  - Primary team notified?  Yes  - Family Notified? Patient has no family or contacts listed. Her ALF was called and notified.      John Giovanni, MD   12/14/2015, 9:15 PM

## 2015-12-28 NOTE — Anesthesia Procedure Notes (Signed)
Procedure Name: Intubation Date/Time: 28-Dec-2015 8:50 PM Performed by: Faustino Congress Yeira Gulden Pre-anesthesia Checklist: Patient identified, Emergency Drugs available, Suction available and Patient being monitored Oxygen Delivery Method: Ambu bag Ventilation: Mask ventilation without difficulty Laryngoscope Size: Mac and 3 Grade View: Grade I Tube type: Oral Tube size: 7.5 mm Number of attempts: 1 Airway Equipment and Method: Stylet Placement Confirmation: ETT inserted through vocal cords under direct vision,  positive ETCO2,  CO2 detector and breath sounds checked- equal and bilateral Secured at: 22 cm Tube secured with: Tape Dental Injury: Teeth and Oropharynx as per pre-operative assessment

## 2015-12-28 NOTE — Progress Notes (Signed)
ANTICOAGULATION CONSULT NOTE - Follow Up Consult  Pharmacy Consult for Heparin  Indication: S/P Cath  Allergies  Allergen Reactions  . Shellfish Allergy Nausea And Vomiting    Patient Measurements: Height:  (160 cm) Weight: 129 lb 3 oz (58.6 kg) IBW/kg (Calculated) : 52.4  Vital Signs: Temp: 97.5 F (36.4 C) (02/27 2336) Temp Source: Oral (02/27 2336) BP: 100/52 mmHg (02/28 0300) Pulse Rate: 93 (02/28 0000)  Labs:  Recent Labs  2016-01-11 0916 2016/01/11 0924 January 11, 2016 1142 01-11-16 1801 01/11/2016 2258 12/04/2015 0245 12/23/2015 0520  HGB 13.1 15.0  --   --   --  12.3  --   HCT 41.1 44.0  --   --   --  37.9  --   PLT 417*  --   --   --   --  326  --   APTT 24  --   --   --   --   --   --   LABPROT 14.5  --   --   --   --  18.4*  --   INR 1.11  --   --   --   --  1.53*  --   HEPARINUNFRC  --   --   --   --   --  1.38* 1.17*  CREATININE 0.82 0.70 0.74  --   --  0.85  --   TROPONINI  --   --  2.92* 2.31* 3.13*  --   --     Estimated Creatinine Clearance: 48 mL/min (by C-G formula based on Cr of 0.85).  Assessment: Heparin level decreased but still supra-therapeutic at 1.17, no issues per RN.   Goal of Therapy:  Heparin level 0.3-0.7 units/ml Monitor platelets by anticoagulation protocol: Yes   Plan:  -Hold heparin x 1 hr -Re-start heparin at 650 units/hr at 0745 -1500 HL  Abran Duke 12/16/2015,6:39 AM

## 2015-12-28 NOTE — Progress Notes (Signed)
Pt was assessed at 12/22/2015 2000pm. Pt vitals were stable Hr NSR 80's, BP 130/72 MAP 91, O2 100% on RA. Pt was confused , but alert which was her reported normal status. While nurse was in other patients room telephone alert for asystole was received, Daine Floras RN went to assess pt for primary nurse. Pt found PEA arrest, non responsive and primary nurse immediatly alerted; ACLS started. See code sheet. RN called patients current living placement at [facility] [phone] and spoke with Roe Coombs who stated pt had "no contacts or living relatives".

## 2015-12-28 NOTE — Significant Event (Signed)
Paged by RN to notify of code blue.  On arrival to room, pt was intubated with ACLS in progress.  Per report, pt admitted with dx of Takotsubo CM.  RN states pt was in her normal state and ready to transfer out of unit and that MD had seen patient recently.  Patient was found unresponsive and pulseless by RN and code blue initiated.  Initial rhythm was PEA, during pulse checks she had 1 asystole with others being PEA.  Pt's ALF contacted in attempt to discuss reach family, however, facility reports pt has no family.  After approximately 20 minutes of ACLS, she remained in PEA and resuscitation efforts stopped.  Patient was pronounced at 21:12 on January 20, 2016.

## 2015-12-28 NOTE — Progress Notes (Signed)
ANTICOAGULATION CONSULT NOTE - Follow Up Consult  Pharmacy Consult for Heparin  Indication: S/P Cath  Allergies  Allergen Reactions  . Shellfish Allergy Nausea And Vomiting    Patient Measurements: Height:  (160 cm) Weight: 129 lb 3 oz (58.6 kg) IBW/kg (Calculated) : 52.4  Vital Signs: Temp: 97.5 F (36.4 C) (02/27 2336) Temp Source: Oral (02/27 2336) BP: 100/52 mmHg (02/28 0300) Pulse Rate: 93 (02/28 0000)  Labs:  Recent Labs  2015-12-28 0916 2015-12-28 0924 12-28-15 1142 12-28-2015 1801 2015-12-28 2258 12/09/2015 0245  HGB 13.1 15.0  --   --   --  12.3  HCT 41.1 44.0  --   --   --  37.9  PLT 417*  --   --   --   --  326  APTT 24  --   --   --   --   --   LABPROT 14.5  --   --   --   --  18.4*  INR 1.11  --   --   --   --  1.53*  HEPARINUNFRC  --   --   --   --   --  1.38*  CREATININE 0.82 0.70 0.74  --   --  0.85  TROPONINI  --   --  2.92* 2.31* 3.13*  --     Estimated Creatinine Clearance: 48 mL/min (by C-G formula based on Cr of 0.85).  Assessment: Heparin level of 1.38 likely elevated due to lab being drawn a few inches from heparin infusion.   Goal of Therapy:  Heparin level 0.3-0.7 units/ml Monitor platelets by anticoagulation protocol: Yes   Plan:  -Re-check HL  Abran Duke 12/18/2015,4:12 AM

## 2015-12-28 DEATH — deceased

## 2016-01-28 NOTE — Discharge Summary (Signed)
  Name: Kerri Carroll MRN: [id number] DOB: [birth year] 75 y.o.  Date of Admission: 11/30/2015  9:21 AM Date of Discharge: 01/13/2016 Attending Physician: No att. providers found  Discharge Diagnosis: Principal Problem:   Takotsubo cardiomyopathy Active Problems:   Essential hypertension   Hypokalemia   ST elevation myocardial infarction (STEMI) (HCC) - ST elevations on EKG but angiographically normal coronary arteries   Atrial fibrillation with RVR (HCC)   Femoral artery occlusion, right (HCC) - noted on right femoral angiogram   Atrial fibrillation with rapid ventricular response (HCC)   Cause of death: Cardiac arrest Time of death: 21:12 2016-04-03  Disposition and follow-up:   [name] [facility].    Hospital Course:   75 yo female with hx of Afib, HTN, chronic back pain, who presented from nursing home with confusion with ?seizure like activities, found to have ST elevation on V2/V3 went to cath lab emergently after negative head CT. Cath revealed normal coronary arteries, but left ventricular systolic dysfunction in Takotsubo pattern. ECHO also confirms this with mod-severely reduced EF 30-35% with dyskinesis of apicalanteroseptal, naterior, anterolateral, lateral, inferolateral, inferior, and inferoseptal myocardium. Basal segment was normal, had appearance of apical ballooning. Highly suggestive of Takotsubo cardiomyopathy but severe multivessel ischemia can also cause similar pattern.   She was hemodynamically stable the morning of her death when we rounded on the patien. For her stress cardiomyopathy,  we were supporting her with bblocker. Was on heparin and coumadin for clot prevention.t. She still continued to have some AMS.  Later this day, she was founded pulseless and unresponsive, initially had PEA. ACLS was performed for 20 mins, she remained in PEA so resuscitation attempts were stopped. Patient was  pronounced on 21:12 on Sep 02, 2016.   Signed: Hyacinth Meekerasrif Avaeh Ewer, MD 01/13/2016, 3:04 PM

## 2016-01-28 DEATH — deceased

## 2016-02-03 ENCOUNTER — Telehealth: Payer: Self-pay | Admitting: Cardiovascular Disease

## 2016-02-03 NOTE — Telephone Encounter (Signed)
4.5.17 Did not receive Original Death Certificate from [employer] until late PM 02/01/16.  Received via Mail.  Gave Certificate to Dr Tresa EndoKelly on 02/02/16 AM and mailed to V[facility] Records on 02/02/16.

## 2016-06-05 IMAGING — US US RENAL
1 series · 14 of 25 positions shown · non-contrast
Comparison: None.

CLINICAL DATA: Acute renal failure.

EXAM:
RENAL / URINARY TRACT ULTRASOUND COMPLETE

[Series 1: us renal · 0.20mm/px · 14 of 43 slices shown]
[im 1/43]
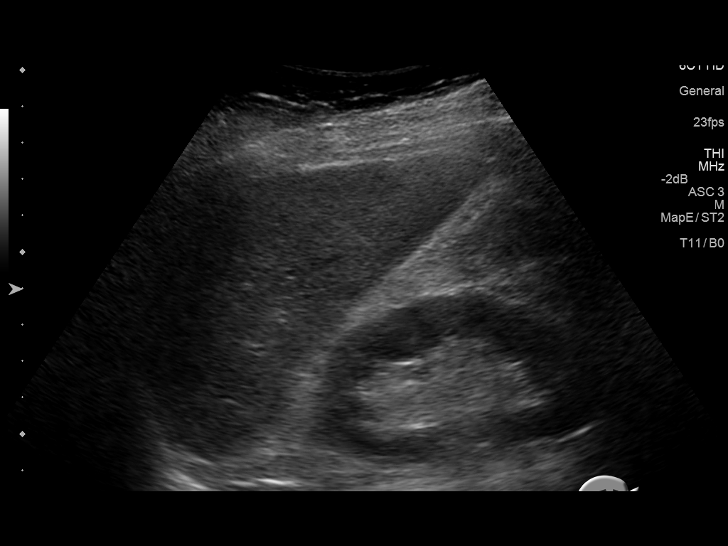
[im 4/43]
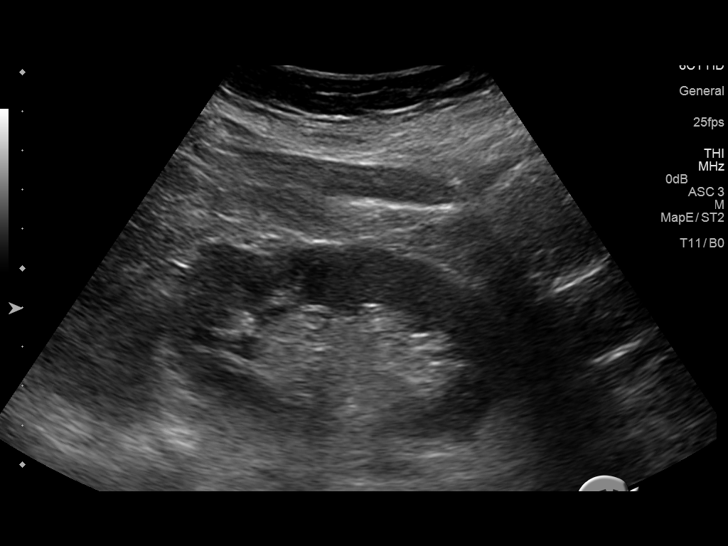
[im 8/43]
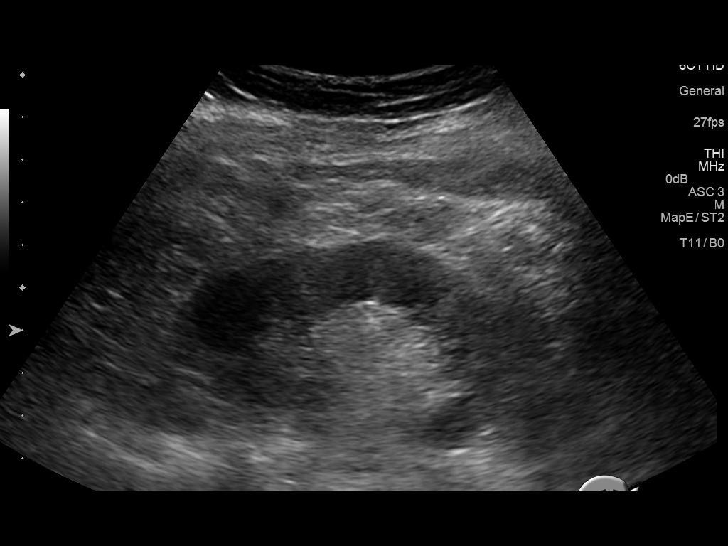
[im 11/43]
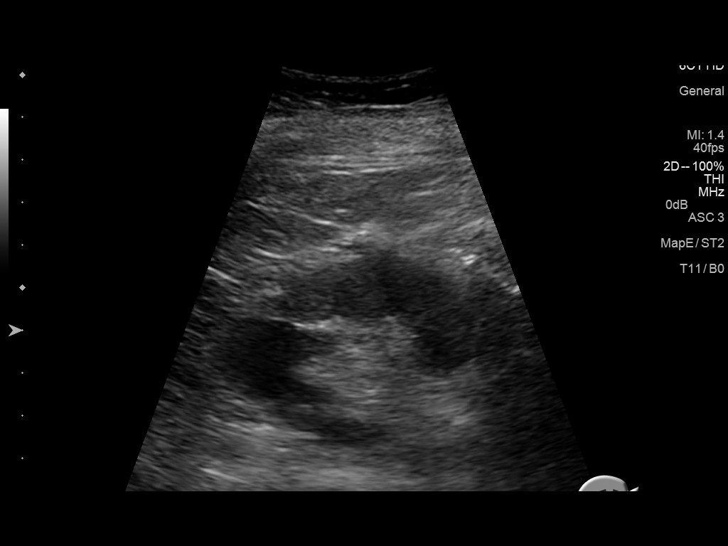
[im 15/43]
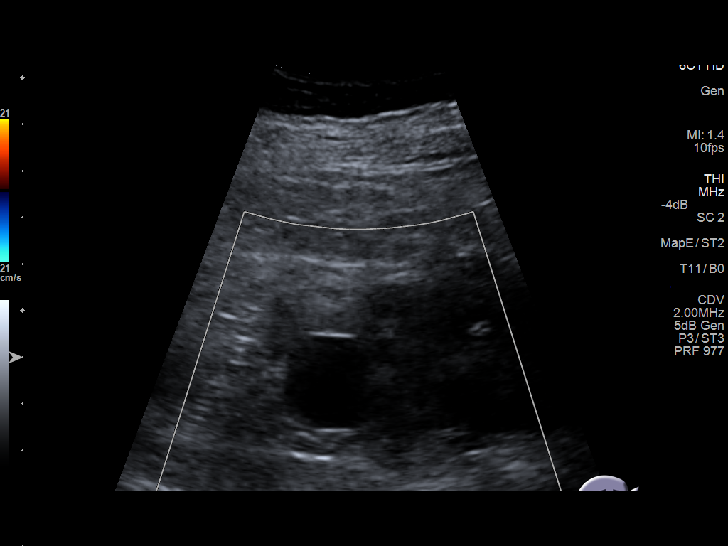
[im 16/43]
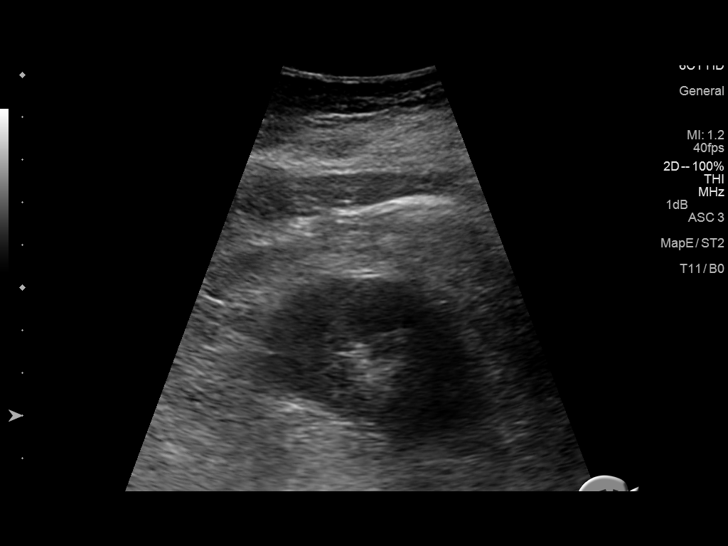
[im 20/43]
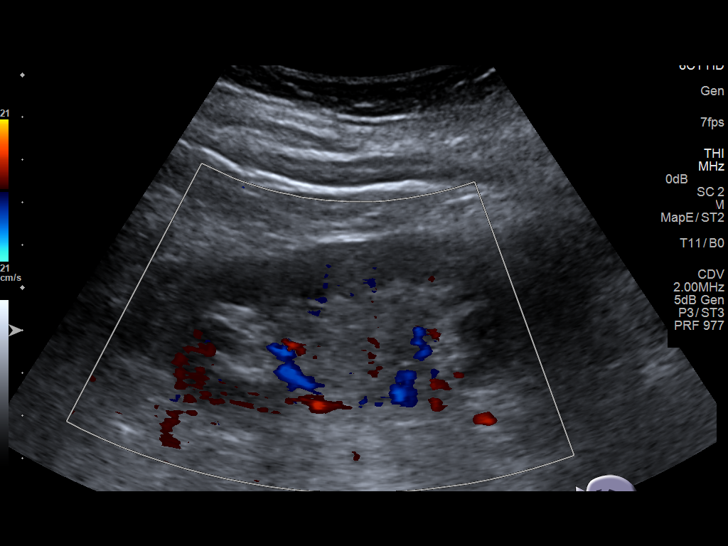
[im 23/43]
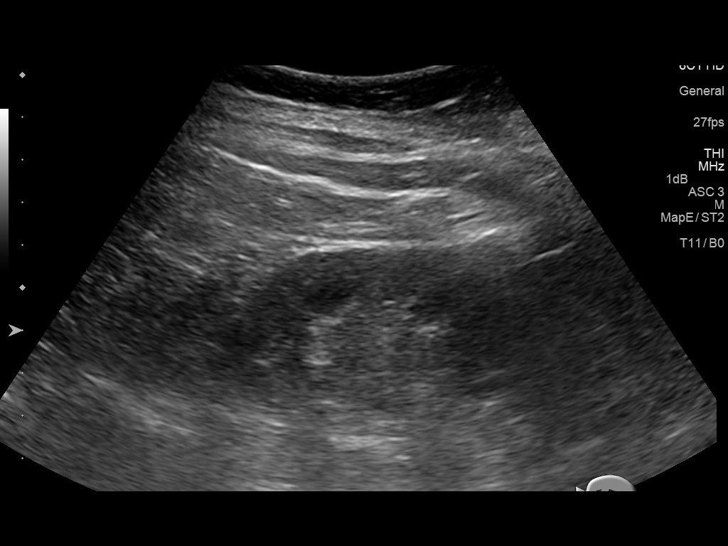
[im 27/43]
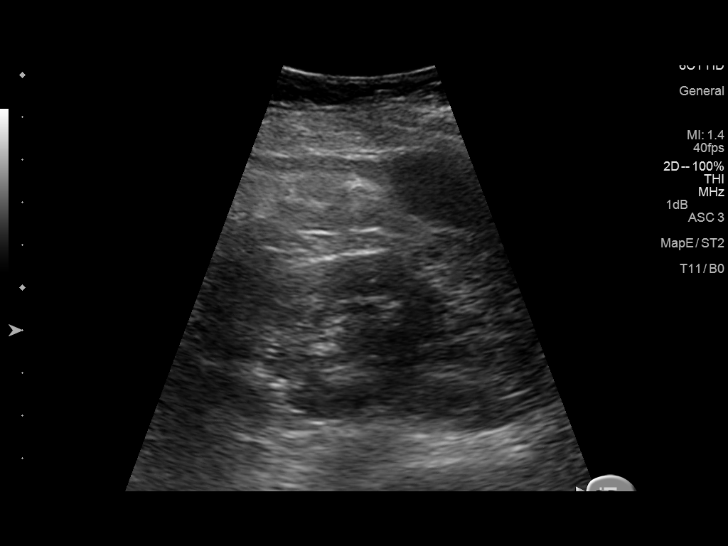
[im 29/43]
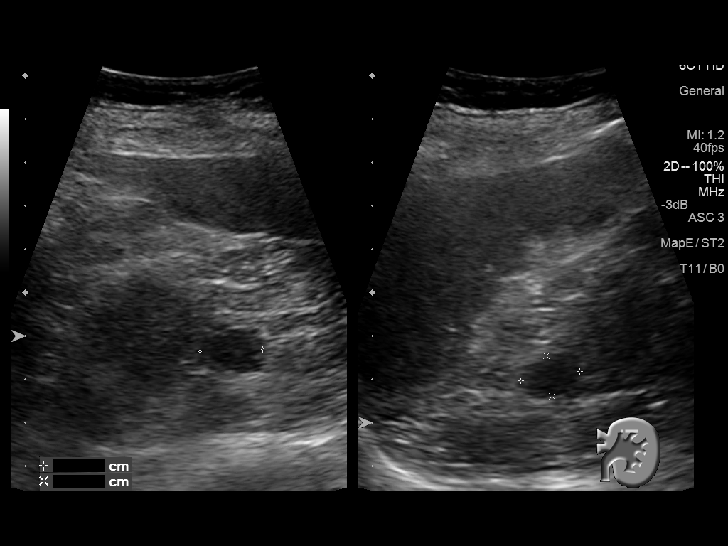
[im 32/43]
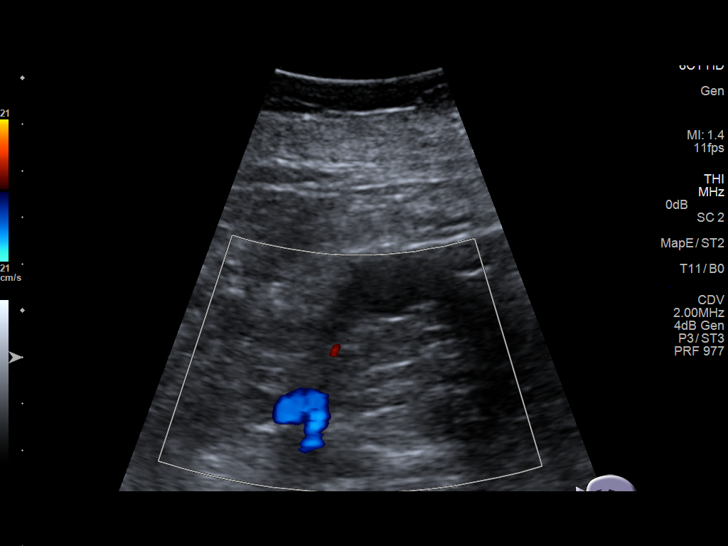
[im 36/43]
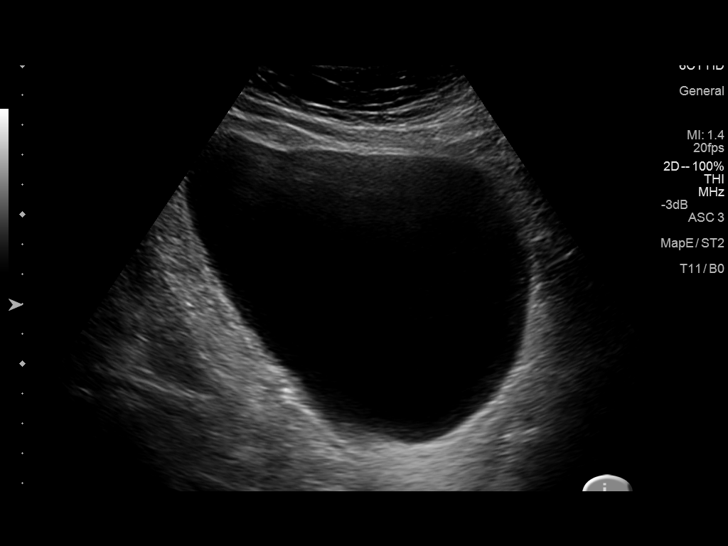
[im 39/43]
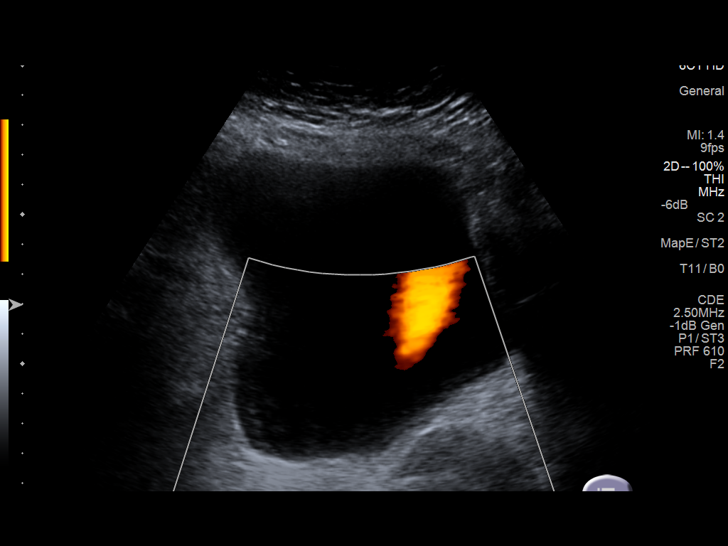
[im 43/43]
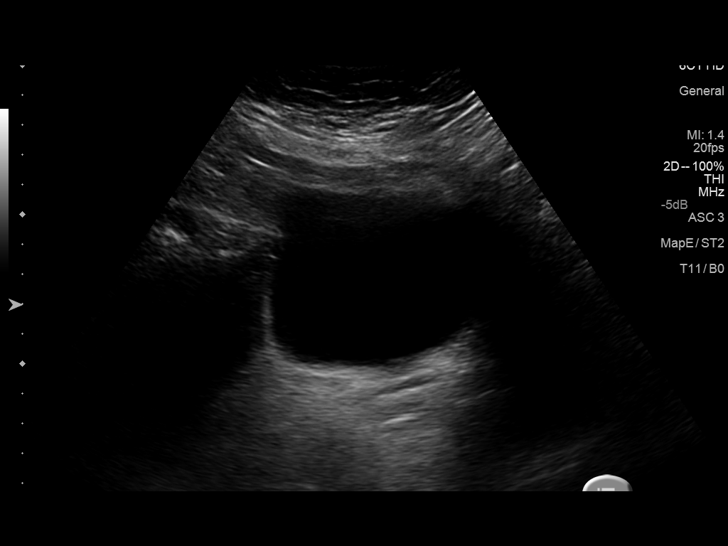

[14 of 25 positions shown; findings below may reference images not displayed]

FINDINGS: Right Kidney:

Length: 9.1 cm. 2.1 cm cyst is noted in midpole. Echogenicity within
normal limits. No mass or hydronephrosis visualized.

Left Kidney:

Length: 9.1 cm. 1.4 cm cyst is noted in upper pole. Echogenicity
within normal limits. No mass or hydronephrosis visualized.

Bladder:

Appears normal for degree of bladder distention. Bilateral ureteral
jets are noted.
IMPRESSION: Small bilateral simple renal cysts. No other significant abnormality
seen in the kidneys.

## 2017-04-09 IMAGING — CT CT HEAD W/O CM
1 series · 16 of 30 positions shown, 20 images · non-contrast
Comparison: None.

CLINICAL DATA: Seizure-like activity. STEMI. Evaluate before
catheterization.

EXAM:
CT HEAD WITHOUT CONTRAST
TECHNIQUE: Contiguous axial images were obtained from the base of the skull
through the vertex without intravenous contrast.

[Series 2: head 5.0 h30s · axial · 0.41mm/px · z∈[-114,+26]mm · 16 of 32 slices shown, 20 images]
[im 2/32  brain]
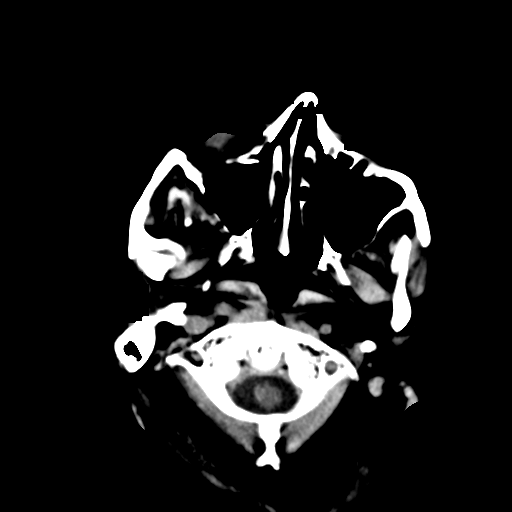
[im 2/32  bone]
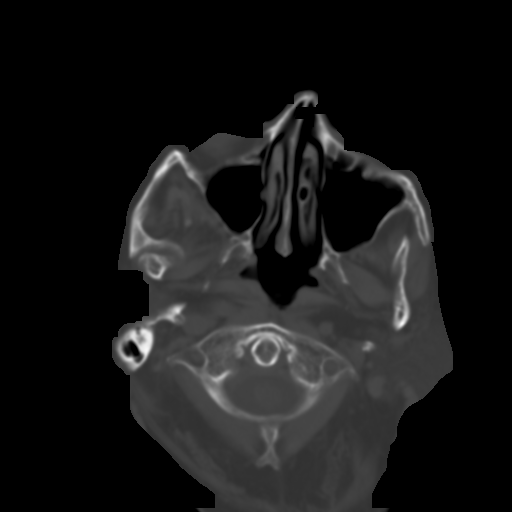
[im 4/32  brain]
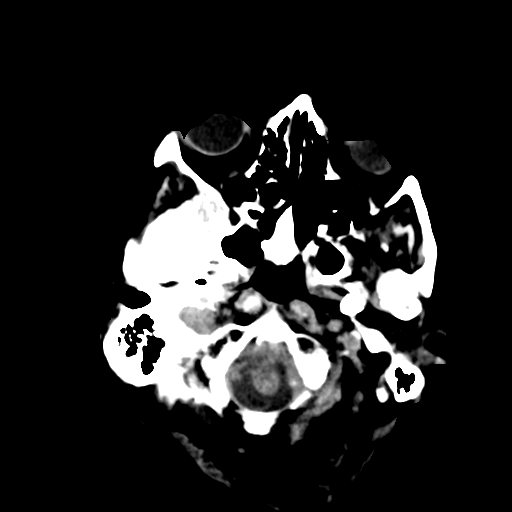
[im 6/32  brain]
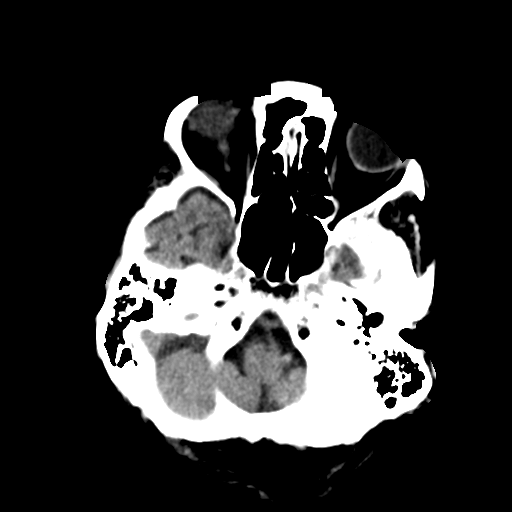
[im 8/32  brain]
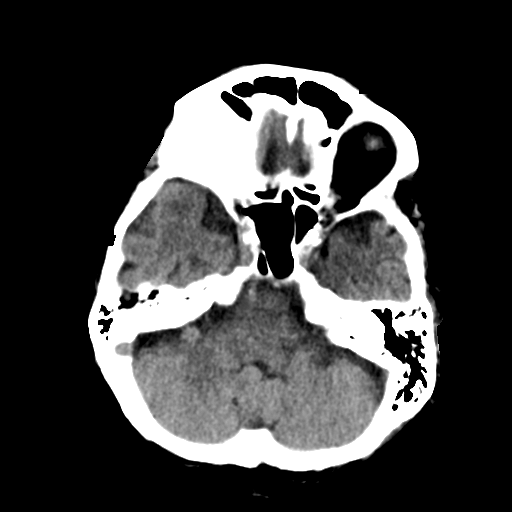
[im 9/32  brain]
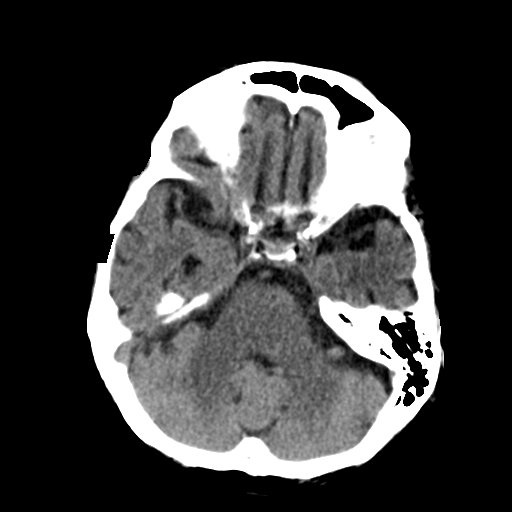
[im 9/32  bone]
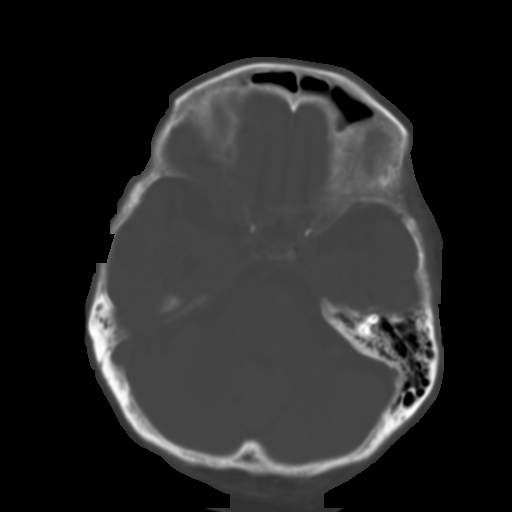
[im 11/32  brain]
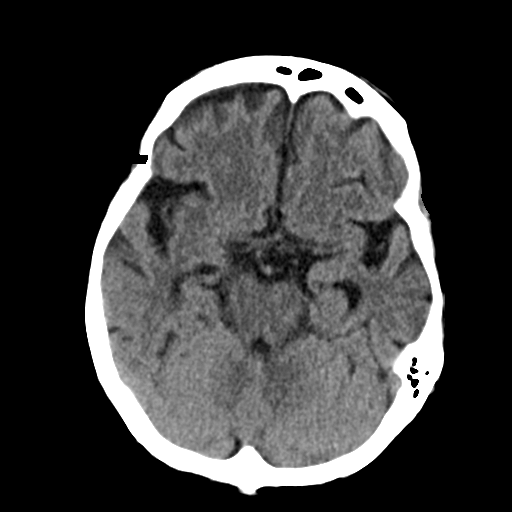
[im 13/32  brain]
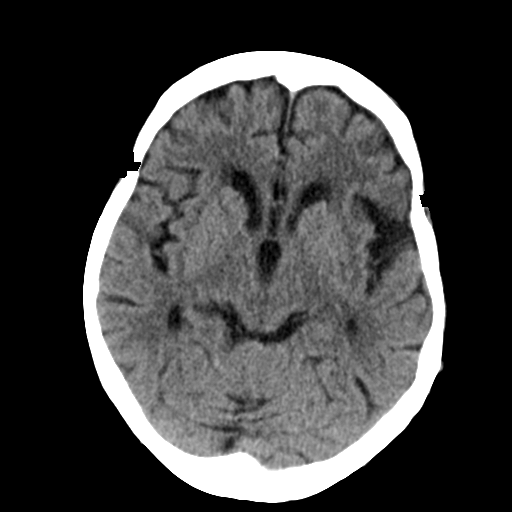
[im 15/32  brain]
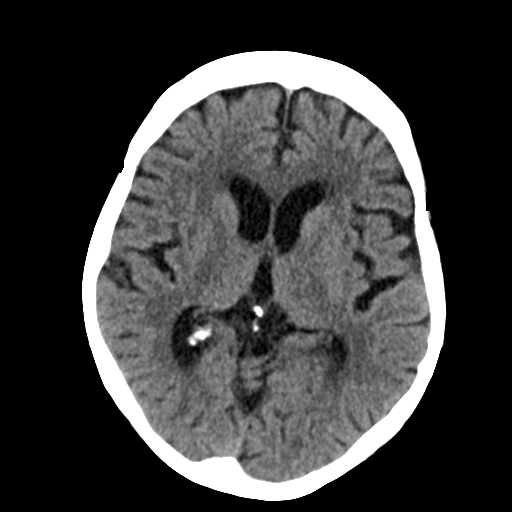
[im 17/32  brain]
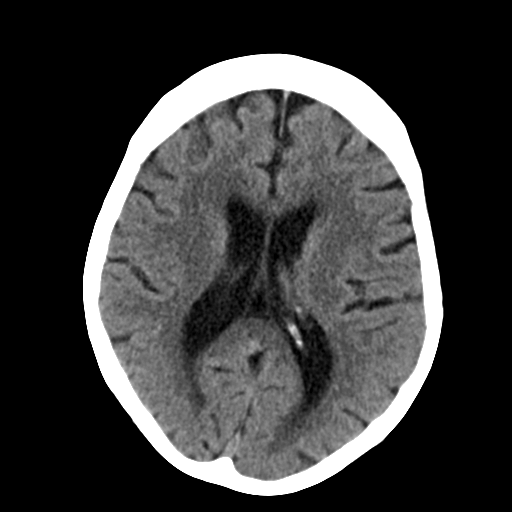
[im 17/32  bone]
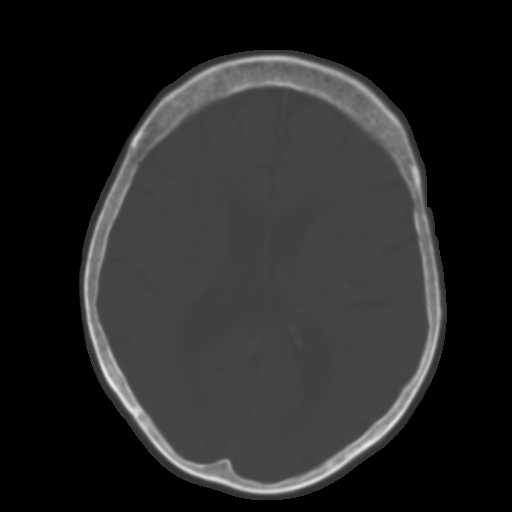
[im 19/32  brain]
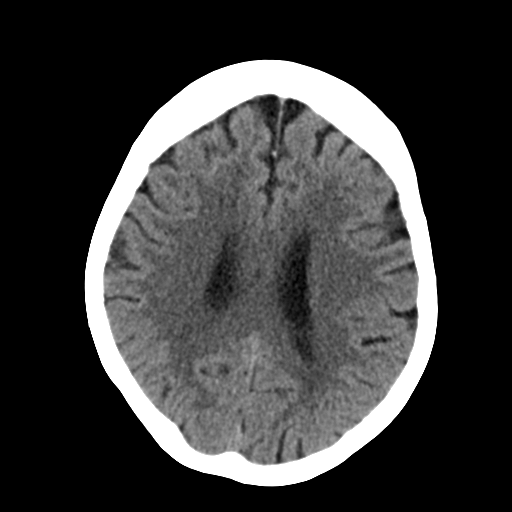
[im 21/32  brain]
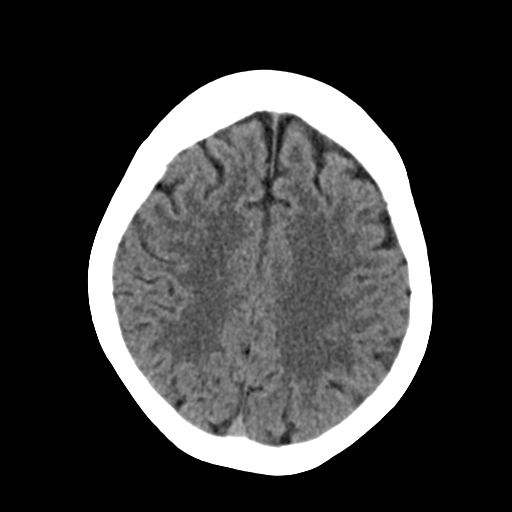
[im 23/32  brain]
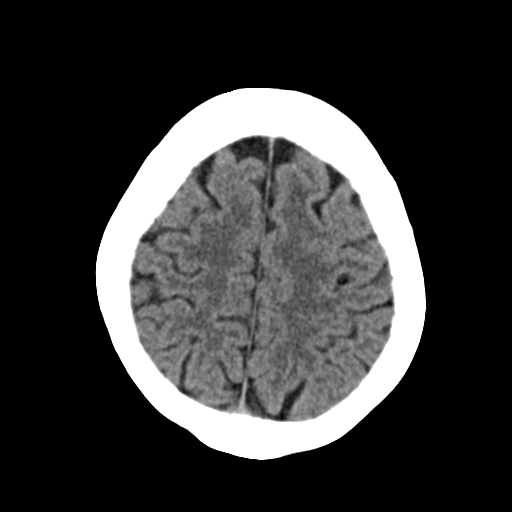
[im 24/32  brain]
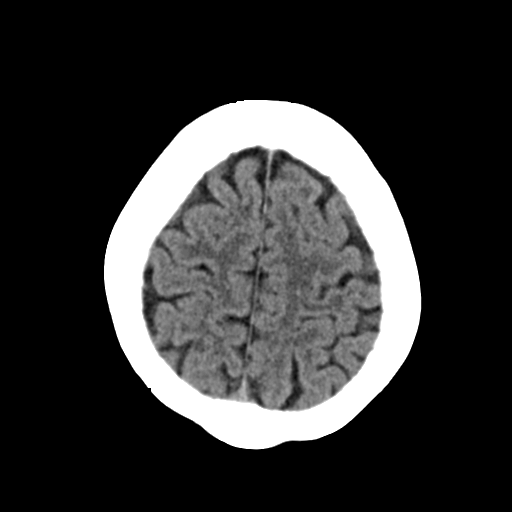
[im 24/32  bone]
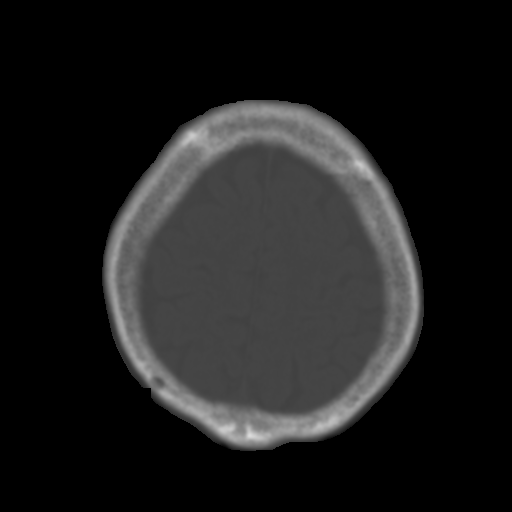
[im 26/32  brain]
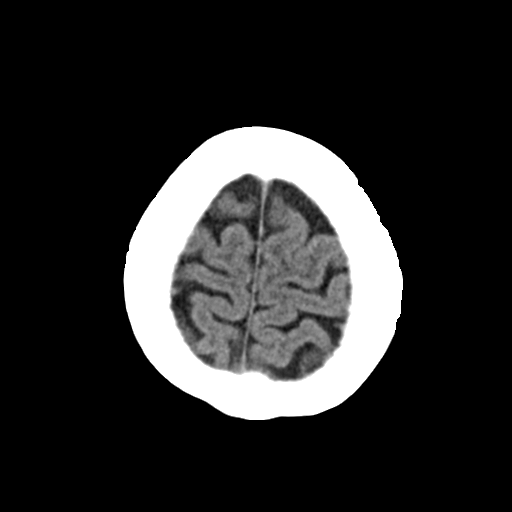
[im 28/32  brain]
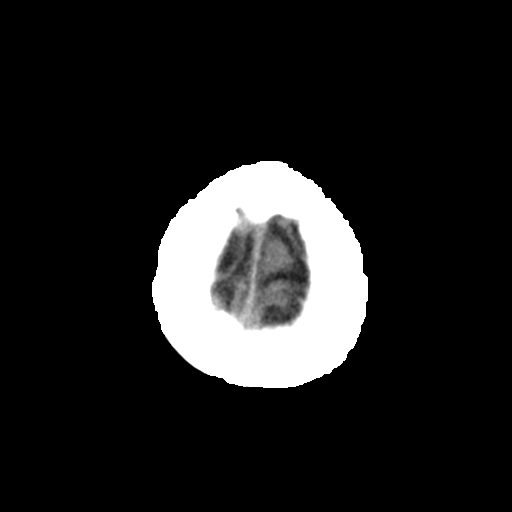
[im 30/32  brain]
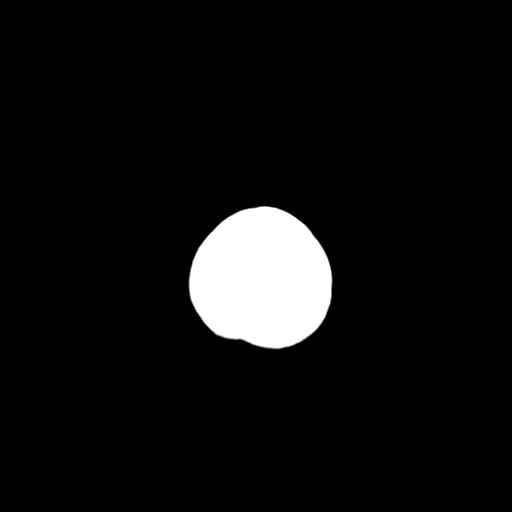

[16 of 30 positions shown; findings below may reference images not displayed]

FINDINGS: Skull and Sinuses:Negative for fracture or destructive process.
Noted TMJ osteoarthritis. The visualized mastoids, middle ears, and
imaged paranasal sinuses are clear.

Visualized orbits: Negative.

Brain: No evidence of acute infarction, hemorrhage, hydrocephalus,
or mass lesion/mass effect.

No cortical finding to explain seizure.

Atrophy, mild for age, accounting for ventriculomegaly.
IMPRESSION: No acute finding.  No explanation for potential seizure.

## 2017-04-09 IMAGING — CR DG CHEST 1V PORT
1 series · 1 of 1 positions shown · non-contrast
Comparison: None.

CLINICAL DATA: Cardiomyopathy.  Shortness of breath.

EXAM:
PORTABLE CHEST 1 VIEW

[AP]
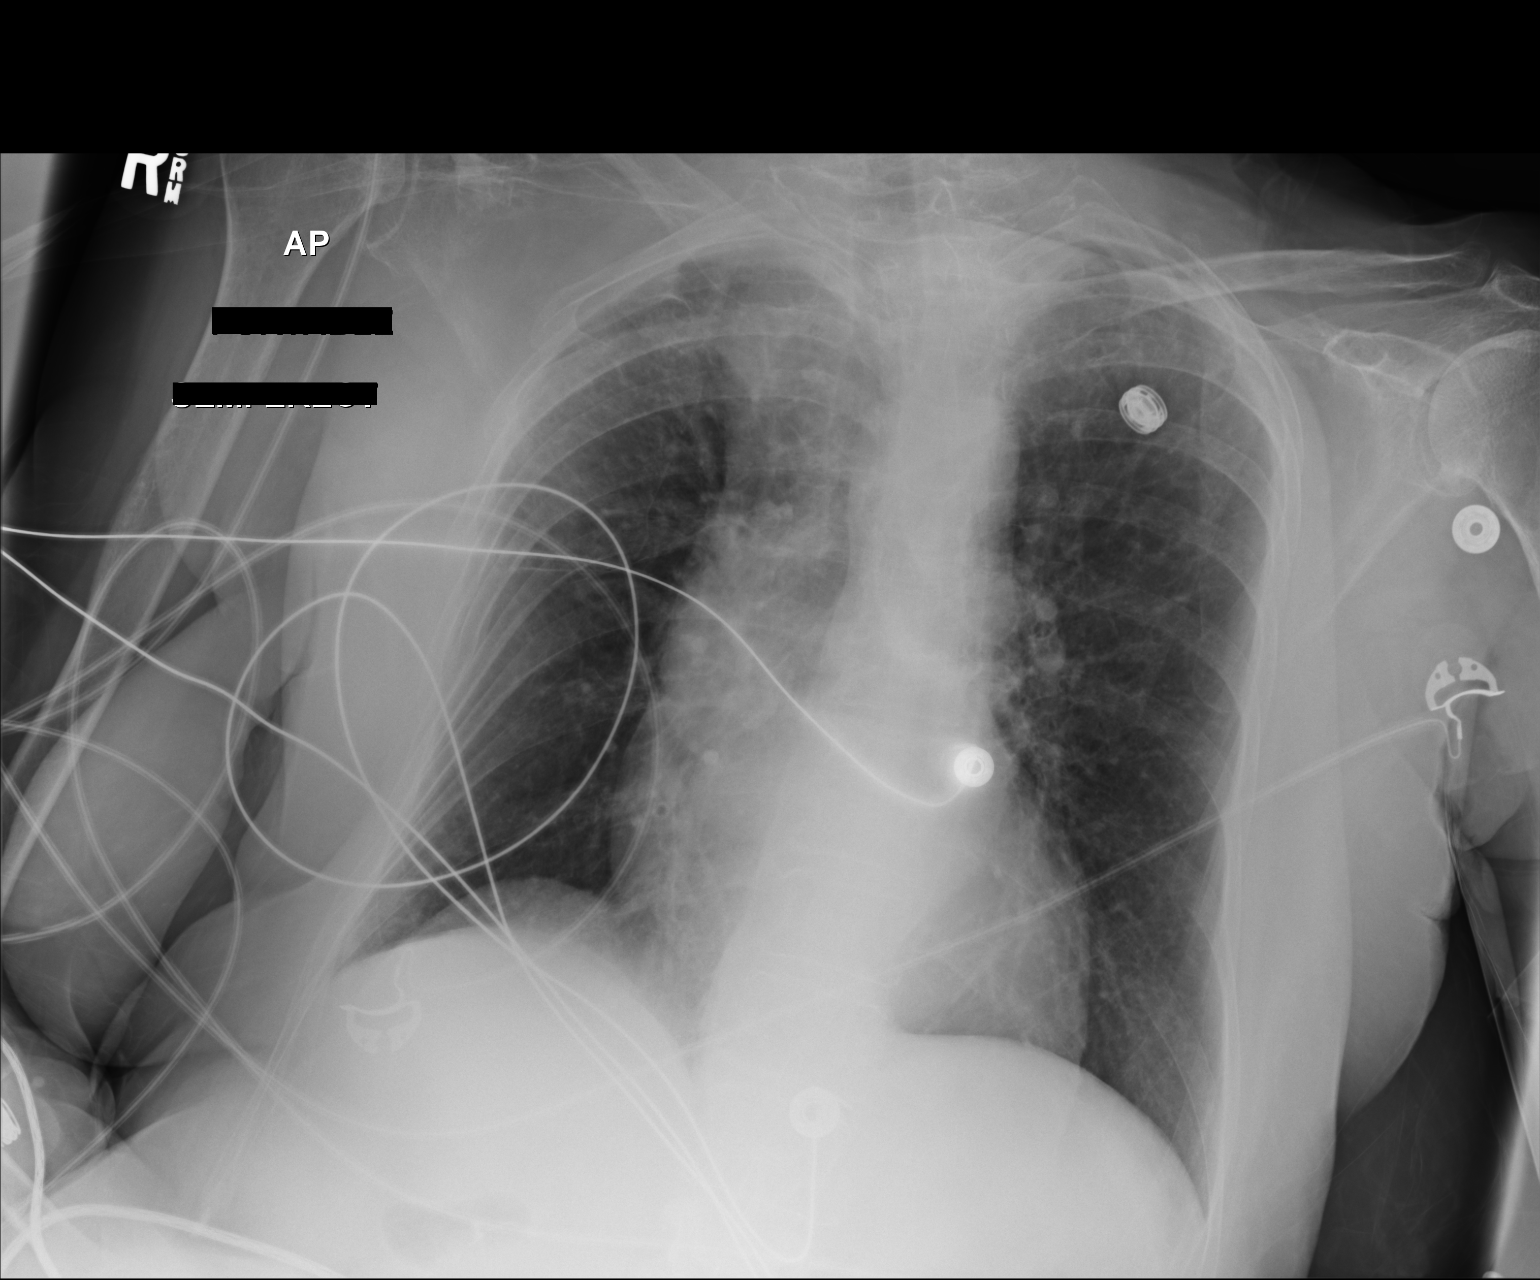

[1 of 1 positions shown; findings below may reference images not displayed]

FINDINGS: Right rotated chest radiograph. Mild cardiomegaly. Minimally
tortuous thoracic aorta. Otherwise normal mediastinal contour. No
pneumothorax. No pleural effusion. There is a questionable focal
opacity at the right lung apex. No overt pulmonary edema.
IMPRESSION: Limited right rotated portable chest radiograph. Questionable focal
right lung apex opacity. Recommend further evaluation with PA and
lateral chest radiographs.

Mild cardiomegaly without overt pulmonary edema.
